# Patient Record
Sex: Female | Born: 1990 | Race: Black or African American | Hispanic: No | Marital: Single | Smoking: Never smoker
Health system: Southern US, Community
[De-identification: ages and names within clinical notes are randomized; demographics above are authoritative.]

## PROBLEM LIST (undated history)

## (undated) DIAGNOSIS — R300 Dysuria: Secondary | ICD-10-CM

## (undated) DIAGNOSIS — D573 Sickle-cell trait: Secondary | ICD-10-CM

## (undated) DIAGNOSIS — R350 Frequency of micturition: Secondary | ICD-10-CM

## (undated) DIAGNOSIS — Z973 Presence of spectacles and contact lenses: Secondary | ICD-10-CM

## (undated) DIAGNOSIS — R310 Gross hematuria: Secondary | ICD-10-CM

## (undated) DIAGNOSIS — K589 Irritable bowel syndrome without diarrhea: Secondary | ICD-10-CM

## (undated) DIAGNOSIS — K219 Gastro-esophageal reflux disease without esophagitis: Secondary | ICD-10-CM

---

## 2011-09-10 HISTORY — PX: LAPAROSCOPIC CHOLECYSTECTOMY: SUR755

## 2016-04-27 ENCOUNTER — Emergency Department (HOSPITAL_COMMUNITY)
Admission: EM | Admit: 2016-04-27 | Discharge: 2016-04-28 | Disposition: A | Discharge status: Home / Self Care | Payer: 59 | Attending: Emergency Medicine | Admitting: Emergency Medicine

## 2016-04-27 ENCOUNTER — Encounter (HOSPITAL_COMMUNITY): Payer: Self-pay

## 2016-04-27 DIAGNOSIS — R319 Hematuria, unspecified: Secondary | ICD-10-CM

## 2016-04-27 DIAGNOSIS — R1010 Upper abdominal pain, unspecified: Secondary | ICD-10-CM

## 2016-04-27 DIAGNOSIS — R1031 Right lower quadrant pain: Secondary | ICD-10-CM | POA: Diagnosis present

## 2016-04-27 DIAGNOSIS — R1030 Lower abdominal pain, unspecified: Secondary | ICD-10-CM

## 2016-04-27 HISTORY — DX: Irritable bowel syndrome without diarrhea: K58.9

## 2016-04-27 LAB — CBC
HCT: 35.5 % — ABNORMAL LOW (ref 36.0–46.0)
Hemoglobin: 12.2 g/dL (ref 12.0–15.0)
MCH: 29.8 pg (ref 26.0–34.0)
MCHC: 34.4 g/dL (ref 30.0–36.0)
MCV: 86.8 fL (ref 78.0–100.0)
PLATELETS: 209 10*3/uL (ref 150–400)
RBC: 4.09 MIL/uL (ref 3.87–5.11)
RDW: 13.1 % (ref 11.5–15.5)
WBC: 5.4 10*3/uL (ref 4.0–10.5)

## 2016-04-27 LAB — COMPREHENSIVE METABOLIC PANEL
ALT: 16 U/L (ref 14–54)
AST: 20 U/L (ref 15–41)
Albumin: 4.4 g/dL (ref 3.5–5.0)
Alkaline Phosphatase: 61 U/L (ref 38–126)
Anion gap: 10 (ref 5–15)
BILIRUBIN TOTAL: 0.8 mg/dL (ref 0.3–1.2)
BUN: 8 mg/dL (ref 6–20)
CALCIUM: 9.4 mg/dL (ref 8.9–10.3)
CHLORIDE: 103 mmol/L (ref 101–111)
CO2: 26 mmol/L (ref 22–32)
Creatinine, Ser: 0.82 mg/dL (ref 0.44–1.00)
Glucose, Bld: 103 mg/dL — ABNORMAL HIGH (ref 65–99)
Potassium: 3.4 mmol/L — ABNORMAL LOW (ref 3.5–5.1)
Sodium: 139 mmol/L (ref 135–145)
TOTAL PROTEIN: 6.9 g/dL (ref 6.5–8.1)

## 2016-04-27 LAB — URINALYSIS, ROUTINE W REFLEX MICROSCOPIC

## 2016-04-27 LAB — URINALYSIS, MICROSCOPIC (REFLEX): WBC UA: NONE SEEN WBC/hpf (ref 0–5)

## 2016-04-27 LAB — CK: Total CK: 81 U/L (ref 38–234)

## 2016-04-27 LAB — LIPASE, BLOOD: LIPASE: 25 U/L (ref 11–51)

## 2016-04-27 LAB — I-STAT BETA HCG BLOOD, ED (MC, WL, AP ONLY)

## 2016-04-27 MED ORDER — SODIUM CHLORIDE 0.9 % IV BOLUS (SEPSIS)
1000.0000 mL | Freq: Once | INTRAVENOUS | Status: AC
  Administered 2016-04-27: 1000 mL via INTRAVENOUS

## 2016-04-27 NOTE — ED Triage Notes (Signed)
Pt here with complaint of chronic abd pain in the RUQ/RLQ. Pt endorses n/v and diarrhea that has been intermittent x 1 week and blood in her urine. Pt has hx of IBS and states that this lower right quadrant pain feels different. Pt ambulatory, VSS.

## 2016-04-27 NOTE — ED Provider Notes (Signed)
[facility]-EMERGENCY DEPT Provider Note   CSN: [id number] Arrival date & time: 04/27/16  1923     History   Chief Complaint Chief Complaint  Patient presents with  . Abdominal Pain    HPI Gwendolyn FinlayLauren Salazar is a 25 y.o. female with a hx of Irritable bowel syndrome and chronic abdominal pain in the right upper and right lower quadrant presents to the Emergency Department complaining of gradual, persistent, progressively worsening right lower quadrant abdominal pain onset personally 1 week ago that feels very different from her chronic pain. She states she's had persistently bloody urine. She reports associated intermittent nausea, vomiting and diarrhea. No hematemesis. No melena or hematochezia. She denies aggravating or alleviating factors. Patient denies fever, chills, nausea, vomiting, shortness of breath, abdominal pain.    The history is provided by the patient and medical records. No language interpreter was used.    Past Medical History:  Diagnosis Date  . IBS (irritable bowel syndrome)     There are no active problems to display for this patient.   Past Surgical History:  Procedure Laterality Date  . CHOLECYSTECTOMY      OB History    No data available       Home Medications    Prior to Admission medications   Medication Sig Start Date End Date Taking? Authorizing Provider  cephALEXin (KEFLEX) 500 MG capsule Take 1 capsule (500 mg total) by mouth 4 (four) times daily. 04/28/16   Dahlia ClientHannah Marti Acebo, PA-C    Family History History reviewed. No pertinent family history.  Social History Social History  Substance Use Topics  . Smoking status: Never Smoker  . Smokeless tobacco: Never Used  . Alcohol use No     Allergies   Patient has no known allergies.   Review of Systems Review of Systems  Gastrointestinal: Positive for nausea and vomiting.  Genitourinary: Positive for dysuria, hematuria and urgency.  All other systems reviewed and are  negative.    Physical Exam Updated Vital Signs BP 106/69   Pulse (!) 56   Temp 98.7 F (37.1 C) (Oral)   Resp 20   Ht 5\' 4"  (1.626 m)   Wt 45.4 kg   LMP 04/20/2016 (Exact Date)   SpO2 99%   BMI 17.16 kg/m   Physical Exam  Constitutional: She appears well-developed and well-nourished. No distress.  Awake, alert, nontoxic appearance  HENT:  Head: Normocephalic and atraumatic.  Mouth/Throat: Oropharynx is clear and moist. No oropharyngeal exudate.  Eyes: Conjunctivae are normal. No scleral icterus.  Neck: Normal range of motion. Neck supple.  Cardiovascular: Normal rate, regular rhythm and intact distal pulses.   Pulmonary/Chest: Effort normal and breath sounds normal. No respiratory distress. She has no wheezes.  Equal chest expansion  Abdominal: Soft. Bowel sounds are normal. She exhibits no mass. There is tenderness in the suprapubic area. There is CVA tenderness ( Left). There is no rebound and no guarding.  Musculoskeletal: Normal range of motion. She exhibits no edema.  Neurological: She is alert.  Speech is clear and goal oriented Moves extremities without ataxia  Skin: Skin is warm and dry. She is not diaphoretic.  Psychiatric: She has a normal mood and affect.  Nursing note and vitals reviewed.    ED Treatments / Results  Labs (all labs ordered are listed, but only abnormal results are displayed) Labs Reviewed  COMPREHENSIVE METABOLIC PANEL - Abnormal; Notable for the following:       Result Value   Potassium 3.4 (*)  Glucose, Bld 103 (*)    All other components within normal limits  CBC - Abnormal; Notable for the following:    HCT 35.5 (*)    All other components within normal limits  URINALYSIS, ROUTINE W REFLEX MICROSCOPIC - Abnormal; Notable for the following:    Color, Urine RED (*)    APPearance TURBID (*)    Glucose, UA   (*)    Value: TEST NOT REPORTED DUE TO COLOR INTERFERENCE OF URINE PIGMENT   Hgb urine dipstick   (*)    Value: TEST NOT  REPORTED DUE TO COLOR INTERFERENCE OF URINE PIGMENT   Bilirubin Urine   (*)    Value: TEST NOT REPORTED DUE TO COLOR INTERFERENCE OF URINE PIGMENT   Ketones, ur   (*)    Value: TEST NOT REPORTED DUE TO COLOR INTERFERENCE OF URINE PIGMENT   Protein, ur   (*)    Value: TEST NOT REPORTED DUE TO COLOR INTERFERENCE OF URINE PIGMENT   Nitrite   (*)    Value: TEST NOT REPORTED DUE TO COLOR INTERFERENCE OF URINE PIGMENT   Leukocytes, UA   (*)    Value: TEST NOT REPORTED DUE TO COLOR INTERFERENCE OF URINE PIGMENT   All other components within normal limits  URINALYSIS, MICROSCOPIC (REFLEX) - Abnormal; Notable for the following:    Bacteria, UA FEW (*)    Squamous Epithelial / LPF 6-30 (*)    All other components within normal limits  URINE CULTURE  LIPASE, BLOOD  CK  I-STAT BETA HCG BLOOD, ED ([facility], [facility], [facility] ONLY)    Radiology Ct Abdomen Pelvis W Contrast  Result Date: 04/28/2016 CLINICAL DATA:  Right lower quadrant abdominal pain. Hematuria. Nausea and vomiting. EXAM: CT ABDOMEN AND PELVIS WITH CONTRAST TECHNIQUE: Multidetector CT imaging of the abdomen and pelvis was performed using the standard protocol following bolus administration of intravenous contrast. CONTRAST:  ISOVUE-300 IOPAMIDOL (ISOVUE-300) INJECTION 61% COMPARISON:  None. FINDINGS: Lower chest: 4 mm subpleural nodule in the left lower lobe is post infectious or inflammatory in a patient of this age. The lung bases are otherwise clear. Hepatobiliary: Probable focal fatty infiltration adjacent to the falciform ligament. No suspicious hepatic lesion. Clips in the gallbladder fossa postcholecystectomy. No biliary dilatation. Pancreas: No ductal dilatation or inflammation. Spleen: Normal in size without focal abnormality. Adrenals/Urinary Tract: Adrenal glands are unremarkable. Kidneys are normal, without renal calculi, focal lesion, or hydronephrosis. Bladder is unremarkable. Stomach/Bowel: Limited evaluation given lack of enteric  contrast and paucity of intra- abdominal fat. Stomach is physiologically distended. Portions of the appendix are tentatively identified in the right lower quadrant and normal. No evidence of pericecal or right lower quadrant inflammation. No evidence of bowel inflammation or distention given limitations. Vascular/Lymphatic: No significant vascular findings are present. No enlarged abdominal or pelvic lymph nodes. Reproductive: A 12 mm peripherally enhancing cyst in the right ovary is likely a corpus luteum. Left ovary grossly normal in size. The uterus is unremarkable. Small volume free fluid in the pelvis is physiologic. Other: No evidence of free air Musculoskeletal: There are no acute or suspicious osseous abnormalities. IMPRESSION: 1. Probable corpus luteal cyst in the right ovary, may be the cause of right lower quadrant pain. 2. Appendix tentatively identified, no evidence of appendicitis. Electronically Signed   By: Rubye Oaks M.D.   On: 04/28/2016 00:49    Procedures Procedures (including critical care time)  Medications Ordered in ED Medications  sodium chloride 0.9 % bolus 1,000 mL (0 mLs  Intravenous Stopped 04/28/16 0040)  iopamidol (ISOVUE-300) 61 % injection (100 mLs  Contrast Given 04/28/16 0026)  cefTRIAXone (ROCEPHIN) 1 g in dextrose 5 % 50 mL IVPB (0 g Intravenous Stopped 04/28/16 0230)  oxyCODONE (Oxy IR/ROXICODONE) immediate release tablet 10 mg (10 mg Oral Given 04/28/16 0146)  ondansetron (ZOFRAN) injection 4 mg (4 mg Intravenous Given 04/28/16 0147)     Initial Impression / Assessment and Plan / ED Course  I have reviewed the triage vital signs and the nursing notes.  Pertinent labs & imaging results that were available during my care of the patient were reviewed by me and considered in my medical decision making (see chart for details).  Clinical Course     Patient presents with acute on chronic abdominal pain.  Pt reports chronic abdominal pain is in the epigastrium.   New abdominal pain is suprapubic in nature. Chest reports gross hematuria. She had 24 hours of dysuria prior to the hematuria. UA is nondiagnostic because of significant blood. CT scan shows no appendicitis. No large masses in the bladder.  Cystitis. Patient is not anemic. No tachycardia or fever. Patient will follow-up with primary care within several days for repeat UA. If hematuria persists patient will need urology follow-up or cystoscopy. We discussed this at length and she agrees to this plan. Also discussed reasons to return including worsening bleeding, fevers, persistent vomiting or other concerns.  Final Clinical Impressions(s) / ED Diagnoses   Final diagnoses:  Hematuria, unspecified type  Lower abdominal pain  Pain of upper abdomen    New Prescriptions Discharge Medication List as of 04/28/2016  2:05 AM    START taking these medications   Details  cephALEXin (KEFLEX) 500 MG capsule Take 1 capsule (500 mg total) by mouth 4 (four) times daily., Starting Sat 04/28/2016, [employer]         Maaz Spiering, PA-C 04/28/16 [id number]    Zadie Rhineonald Wickline, MD 04/28/16 [phone]

## 2016-04-28 ENCOUNTER — Emergency Department (HOSPITAL_COMMUNITY): Payer: 59

## 2016-04-28 MED ORDER — CEPHALEXIN 500 MG PO CAPS: 500.0000 mg | ORAL_CAPSULE | Freq: Four times a day (QID) | ORAL | 0 refills | Status: DC

## 2016-04-28 MED ORDER — ONDANSETRON HCL 4 MG/2ML IJ SOLN
4.0000 mg | Freq: Once | INTRAMUSCULAR | Status: AC
Start: 2016-04-28 — End: 2016-04-28
  Administered 2016-04-28: 4 mg via INTRAVENOUS
  Filled 2016-04-28: qty 2

## 2016-04-28 MED ORDER — IOPAMIDOL (ISOVUE-300) INJECTION 61%
INTRAVENOUS | Status: AC
  Administered 2016-04-28: 100 mL
  Filled 2016-04-28: qty 100

## 2016-04-28 MED ORDER — OXYCODONE HCL 5 MG PO TABS
10.0000 mg | ORAL_TABLET | Freq: Once | ORAL | Status: AC
  Administered 2016-04-28: 10 mg via ORAL
  Filled 2016-04-28: qty 2

## 2016-04-28 MED ORDER — CEFTRIAXONE SODIUM 1 G IJ SOLR
1.0000 g | Freq: Once | INTRAMUSCULAR | Status: AC
  Administered 2016-04-28: 1 g via INTRAVENOUS
  Filled 2016-04-28: qty 10

## 2016-04-28 NOTE — Discharge Instructions (Signed)
1. Medications: keflex, usual home medications °2. Treatment: rest, drink plenty of fluids, take medications as prescribed °3. Follow Up: Please followup with your primary doctor in 3 days for discussion of your diagnoses and further evaluation after today's visit; if you do not have a primary care doctor use the resource guide provided to find one; return to the ER for fevers, persistent vomiting, worsening abdominal pain or other concerning symptoms. ° °

## 2016-04-29 LAB — URINE CULTURE: Culture: <10000 — AB

## 2016-07-11 ENCOUNTER — Emergency Department
Admission: EM | Admit: 2016-07-11 | Discharge: 2016-07-11 | Disposition: A | Discharge status: Home / Self Care | Payer: 59 | Attending: Emergency Medicine | Admitting: Emergency Medicine

## 2016-07-11 ENCOUNTER — Emergency Department: Payer: 59

## 2016-07-11 DIAGNOSIS — E876 Hypokalemia: Secondary | ICD-10-CM | POA: Insufficient documentation

## 2016-07-11 DIAGNOSIS — N390 Urinary tract infection, site not specified: Secondary | ICD-10-CM | POA: Diagnosis not present

## 2016-07-11 DIAGNOSIS — R1031 Right lower quadrant pain: Secondary | ICD-10-CM

## 2016-07-11 DIAGNOSIS — R319 Hematuria, unspecified: Secondary | ICD-10-CM

## 2016-07-11 LAB — COMPREHENSIVE METABOLIC PANEL
ALT: 33 U/L (ref 14–54)
ANION GAP: 11 (ref 5–15)
AST: 43 U/L — ABNORMAL HIGH (ref 15–41)
Albumin: 5 g/dL (ref 3.5–5.0)
Alkaline Phosphatase: 69 U/L (ref 38–126)
BUN: 11 mg/dL (ref 6–20)
CALCIUM: 9.7 mg/dL (ref 8.9–10.3)
CHLORIDE: 105 mmol/L (ref 101–111)
CO2: 23 mmol/L (ref 22–32)
Creatinine, Ser: 0.88 mg/dL (ref 0.44–1.00)
GFR calc non Af Amer: >60 mL/min (ref >60)
Glucose, Bld: 132 mg/dL — ABNORMAL HIGH (ref 65–99)
Potassium: 2.9 mmol/L — ABNORMAL LOW (ref 3.5–5.1)
SODIUM: 139 mmol/L (ref 135–145)
Total Bilirubin: 1 mg/dL (ref 0.3–1.2)
Total Protein: 8.2 g/dL — ABNORMAL HIGH (ref 6.5–8.1)

## 2016-07-11 LAB — LIPASE, BLOOD: LIPASE: 22 U/L (ref 11–51)

## 2016-07-11 LAB — URINALYSIS, COMPLETE (UACMP) WITH MICROSCOPIC
Bacteria, UA: NONE SEEN
Bilirubin Urine: NEGATIVE
GLUCOSE, UA: 50 mg/dL — AB
Ketones, ur: 80 mg/dL — AB
Leukocytes, UA: NEGATIVE
Nitrite: NEGATIVE
PROTEIN: 100 mg/dL — AB
Specific Gravity, Urine: 1.018 (ref 1.005–1.030)
pH: 6 (ref 5.0–8.0)

## 2016-07-11 LAB — CBC
HCT: 37.9 % (ref 35.0–47.0)
HEMOGLOBIN: 13 g/dL (ref 12.0–16.0)
MCH: 30.9 pg (ref 26.0–34.0)
MCHC: 34.2 g/dL (ref 32.0–36.0)
MCV: 90.4 fL (ref 80.0–100.0)
Platelets: 209 10*3/uL (ref 150–440)
RBC: 4.19 MIL/uL (ref 3.80–5.20)
RDW: 13.7 % (ref 11.5–14.5)
WBC: 10.3 10*3/uL (ref 3.6–11.0)

## 2016-07-11 LAB — POCT PREGNANCY, URINE: Preg Test, Ur: NEGATIVE

## 2016-07-11 MED ORDER — ONDANSETRON HCL 4 MG/2ML IJ SOLN
INTRAMUSCULAR | Status: AC
  Administered 2016-07-11: 4 mg via INTRAVENOUS
  Filled 2016-07-11: qty 2

## 2016-07-11 MED ORDER — ONDANSETRON HCL 4 MG/2ML IJ SOLN
4.0000 mg | Freq: Once | INTRAMUSCULAR | Status: AC
  Administered 2016-07-11: 4 mg via INTRAVENOUS

## 2016-07-11 MED ORDER — POTASSIUM CHLORIDE CRYS ER 20 MEQ PO TBCR
40.0000 meq | EXTENDED_RELEASE_TABLET | Freq: Once | ORAL | Status: AC
  Administered 2016-07-11: 40 meq via ORAL

## 2016-07-11 MED ORDER — POTASSIUM CHLORIDE CRYS ER 20 MEQ PO TBCR
EXTENDED_RELEASE_TABLET | ORAL | Status: AC
  Administered 2016-07-11: 40 meq via ORAL
  Filled 2016-07-11: qty 2

## 2016-07-11 MED ORDER — MORPHINE SULFATE (PF) 4 MG/ML IV SOLN
INTRAVENOUS | Status: AC
  Administered 2016-07-11: 4 mg via INTRAVENOUS
  Filled 2016-07-11: qty 1

## 2016-07-11 MED ORDER — POTASSIUM CHLORIDE ER 10 MEQ PO TBCR: 10.0000 meq | EXTENDED_RELEASE_TABLET | Freq: Once | ORAL | 0 refills | Status: DC

## 2016-07-11 MED ORDER — IOPAMIDOL (ISOVUE-300) INJECTION 61%
30.0000 mL | Freq: Once | INTRAVENOUS | Status: AC
  Administered 2016-07-11: 30 mL via ORAL
  Filled 2016-07-11: qty 30

## 2016-07-11 MED ORDER — MORPHINE SULFATE (PF) 4 MG/ML IV SOLN
4.0000 mg | Freq: Once | INTRAVENOUS | Status: AC
  Administered 2016-07-11: 4 mg via INTRAVENOUS

## 2016-07-11 MED ORDER — CEPHALEXIN 500 MG PO CAPS
500.0000 mg | ORAL_CAPSULE | Freq: Once | ORAL | Status: AC
  Administered 2016-07-11: 500 mg via ORAL
  Filled 2016-07-11: qty 1

## 2016-07-11 MED ORDER — IOPAMIDOL (ISOVUE-300) INJECTION 61%
75.0000 mL | Freq: Once | INTRAVENOUS | Status: AC | PRN
  Administered 2016-07-11: 75 mL via INTRAVENOUS
  Filled 2016-07-11: qty 75

## 2016-07-11 MED ORDER — CEPHALEXIN 500 MG PO CAPS: 500.0000 mg | ORAL_CAPSULE | Freq: Three times a day (TID) | ORAL | 0 refills | Status: AC

## 2016-07-11 MED ORDER — SODIUM CHLORIDE 0.9 % IV BOLUS (SEPSIS)
1000.0000 mL | Freq: Once | INTRAVENOUS | Status: AC
  Administered 2016-07-11: 1000 mL via INTRAVENOUS

## 2016-07-11 NOTE — ED Notes (Signed)
Pt sleeping in lobby, when awoke pt states "my stomach is killing me". States RLQ abd pain that began this AM around 1100, pt awake and alert in no acute distress

## 2016-07-11 NOTE — ED Provider Notes (Signed)
[facility] Emergency Department Provider Note  ____________________________________________   First MD Initiated Contact with Patient 07/11/16 1458     (approximate)  I have reviewed the triage vital signs and the nursing notes.   HISTORY  Chief Complaint Abdominal Pain   HPI Gwendolyn FinlayLauren Wlodarczyk is a 26 y.o. female with a history of Crohn's disease as well as IBS was presenting with right lower quadrant pain that started this morning. She says that she is also had burning with urination and blood in her urine over the past several months. She is currently undergoing a workup with urology and has an appointment coming this Monday. She says in December she was having similar issues and was given antibiotics which resolved the issue is temporarily. She says that her urine appears to be dark in color which is also how she percent previously with a similar issue. She says the pain is 9 out of 10 and cramping. Radiates to her right lower back as well. Also with diarrhea with a small amount of blood in it this morning. Denies any nausea or vomiting. Denies any vaginal bleeding or discharge.   Past Medical History:  Diagnosis Date  . IBS (irritable bowel syndrome)     There are no active problems to display for this patient.   Past Surgical History:  Procedure Laterality Date  . CHOLECYSTECTOMY      Prior to Admission medications   Medication Sig Start Date End Date Taking? Authorizing Provider  cephALEXin (KEFLEX) 500 MG capsule Take 1 capsule (500 mg total) by mouth 4 (four) times daily. 04/28/16   Hannah Muthersbaugh, PA-C    Allergies Patient has no known allergies.  No family history on file.  Social History Social History  Substance Use Topics  . Smoking status: Never Smoker  . Smokeless tobacco: Never Used  . Alcohol use No    Review of Systems Constitutional: No fever/chills Eyes: No visual changes. ENT: No sore throat. Cardiovascular: Denies  chest pain. Respiratory: Denies shortness of breath. Gastrointestinal:  No constipation. Genitourinary: Negative for dysuria. Musculoskeletal: as above Skin: Negative for rash. Neurological: Negative for headaches, focal weakness or numbness.  10-point ROS otherwise negative.  ____________________________________________   PHYSICAL EXAM:  VITAL SIGNS: ED Triage Vitals  Enc Vitals Group     BP 07/11/16 1326 137/81     Pulse Rate 07/11/16 1326 62     Resp 07/11/16 1326 (!) 22     Temp 07/11/16 1326 97.8 F (36.6 C)     Temp Source 07/11/16 1326 Oral     SpO2 07/11/16 1326 100 %     Weight 07/11/16 1326 100 lb (45.4 kg)     Height 07/11/16 1326 5\' 3"  (1.6 m)     Head Circumference --      Peak Flow --      Pain Score 07/11/16 1330 10     Pain Loc --      Pain Edu? --      Excl. in GC? --     Constitutional: Alert and oriented. Well appearing and in no acute distress. Eyes: Conjunctivae are normal. PERRL. EOMI. Head: Atraumatic. Nose: No congestion/rhinnorhea. Mouth/Throat: Mucous membranes are moist.   Neck: No stridor.   Cardiovascular: Normal rate, regular rhythm. Grossly normal heart sounds.   Respiratory: Normal respiratory effort.  No retractions. Lungs CTAB. Gastrointestinal: Soft With moderate right upper and right lower quadrant tenderness with a mild amount of guarding. No distention.  No CVA tenderness. Musculoskeletal:  No lower extremity tenderness nor edema.  No joint effusions. Neurologic:  Normal speech and language. No gross focal neurologic deficits are appreciated.  Skin:  Skin is warm, dry and intact. No rash noted. Psychiatric: Mood and affect are normal. Speech and behavior are normal.  ____________________________________________   LABS (all labs ordered are listed, but only abnormal results are displayed)  Labs Reviewed  COMPREHENSIVE METABOLIC PANEL - Abnormal; Notable for the following:       Result Value   Potassium 2.9 (*)    Glucose,  Bld 132 (*)    Total Protein 8.2 (*)    AST 43 (*)    All other components within normal limits  URINALYSIS, COMPLETE (UACMP) WITH MICROSCOPIC - Abnormal; Notable for the following:    Color, Urine RED (*)    APPearance CLOUDY (*)    Glucose, UA 50 (*)    Hgb urine dipstick LARGE (*)    Ketones, ur 80 (*)    Protein, ur 100 (*)    Squamous Epithelial / LPF 0-5 (*)    All other components within normal limits  URINE CULTURE  LIPASE, BLOOD  CBC  POC URINE PREG, ED  POCT PREGNANCY, URINE   ____________________________________________  EKG   ____________________________________________  RADIOLOGY    CT Abdomen Pelvis W Contrast (Final result)  Result time 07/11/16 18:49:56  Final result by Signa Kell, MD (07/11/16 18:49:56)           Narrative:   CLINICAL DATA: Right lower quadrant pain  EXAM: CT ABDOMEN AND PELVIS WITH CONTRAST  TECHNIQUE: Multidetector CT imaging of the abdomen and pelvis was performed using the standard protocol following bolus administration of intravenous contrast.  CONTRAST: 75mL ISOVUE-300 IOPAMIDOL (ISOVUE-300) INJECTION 61%  COMPARISON: 04/28/2016  FINDINGS: Lower chest: Nodule within the left lower lobe measures 3 mm and is unchanged from previous exam. No pleural effusion identified.  Hepatobiliary: No focal liver abnormality. Previous cholecystectomy. No biliary dilatation.  Pancreas: Unremarkable. No pancreatic ductal dilatation or surrounding inflammatory changes.  Spleen: Normal in size without focal abnormality.  Adrenals/Urinary Tract: The adrenal glands are normal. No mass or hydronephrosis identified. The urinary bladder appears normal.  Stomach/Bowel: The stomach is unremarkable. The small bowel loops have a normal course and caliber. The appendix is not visualized separate from the right lower quadrant bowel loops. No secondary signs of acute appendicitis. No pathologic dilatation of the  colon.  Vascular/Lymphatic: Normal appearance of the abdominal aorta. No enlarged retroperitoneal or mesenteric adenopathy. No enlarged pelvic or inguinal lymph nodes.  Reproductive: Uterus and bilateral adnexa are unremarkable.  Other: No abdominal wall hernia or abnormality. No abdominopelvic ascites.  Musculoskeletal: No acute or significant osseous findings.  IMPRESSION: 1. No acute findings within the abdomen or pelvis. 2. Nonvisualization of the appendix. No secondary signs of acute appendicitis.   Electronically Signed By: Signa Kell M.D. On: 07/11/2016 18:49          ____________________________________________   PROCEDURES  Procedure(s) performed:   Procedures  Critical Care performed:   ____________________________________________   INITIAL IMPRESSION / ASSESSMENT AND PLAN / ED COURSE  Pertinent labs & imaging results that were available during my care of the patient were reviewed by me and considered in my medical decision making (see chart for details).  ----------------------------------------- 7:06 PM on 07/11/2016 -----------------------------------------  Patient resting over this time. Reassuring CAT scan. She'll be treated again with Keflex as she has been treated in the past for similar issues. She'll be following up with  urology this coming Monday at her scheduled appointment. Unlikely to be appendicitis as there are no secondary signs and the patient has more likely causes for her symptoms. I asked when the radiology or reports as well as the lab results the patient as well as the plan and she is understanding and willing to comply.      ____________________________________________   FINAL CLINICAL IMPRESSION(S) / ED DIAGNOSES  Abdominal pain. UTI.    NEW MEDICATIONS STARTED DURING THIS VISIT:  New Prescriptions   No medications on file     Note:  This document was prepared using Dragon voice recognition software and  may include unintentional dictation errors.    Myrna Blazer, MD 07/11/16 [phone]

## 2016-07-11 NOTE — ED Triage Notes (Signed)
RLQ pain that started at 11AM today. Pt very anxious. Pt alert and oriented X4. .Marland Kitchen

## 2016-07-13 LAB — URINE CULTURE: Culture: 50000 — AB

## 2016-07-16 ENCOUNTER — Other Ambulatory Visit: Payer: Self-pay | Admitting: Urology

## 2016-07-19 ENCOUNTER — Encounter (HOSPITAL_BASED_OUTPATIENT_CLINIC_OR_DEPARTMENT_OTHER): Payer: Self-pay | Admitting: *Deleted

## 2016-07-23 ENCOUNTER — Encounter (HOSPITAL_BASED_OUTPATIENT_CLINIC_OR_DEPARTMENT_OTHER): Payer: Self-pay | Admitting: *Deleted

## 2016-07-23 NOTE — Progress Notes (Signed)
NPO AFTER MN.  ARRIVE AT 1000.  NEEDS ISTAT (TO RECHECK K+, WAS 2.9 on 02.21.2018, WAS PUT k+ supplement).  OTHER CURRENT LAB RESULTS IN CHART AND EPIC.

## 2016-07-26 ENCOUNTER — Ambulatory Visit (HOSPITAL_BASED_OUTPATIENT_CLINIC_OR_DEPARTMENT_OTHER): Payer: 59 | Admitting: Anesthesiology

## 2016-07-26 ENCOUNTER — Encounter (HOSPITAL_BASED_OUTPATIENT_CLINIC_OR_DEPARTMENT_OTHER): Payer: Self-pay | Admitting: *Deleted

## 2016-07-26 ENCOUNTER — Ambulatory Visit (HOSPITAL_BASED_OUTPATIENT_CLINIC_OR_DEPARTMENT_OTHER)
Admission: RE | Admit: 2016-07-26 | Discharge: 2016-07-26 | Disposition: A | Discharge status: Home / Self Care | Payer: 59 | Source: Ambulatory Visit | Attending: Urology | Admitting: Urology

## 2016-07-26 ENCOUNTER — Encounter (HOSPITAL_BASED_OUTPATIENT_CLINIC_OR_DEPARTMENT_OTHER)
Admission: RE | Disposition: A | Discharge status: Home / Self Care | Payer: Self-pay | Source: Ambulatory Visit | Attending: Urology

## 2016-07-26 DIAGNOSIS — K219 Gastro-esophageal reflux disease without esophagitis: Secondary | ICD-10-CM | POA: Diagnosis not present

## 2016-07-26 DIAGNOSIS — R350 Frequency of micturition: Secondary | ICD-10-CM | POA: Diagnosis not present

## 2016-07-26 DIAGNOSIS — R310 Gross hematuria: Secondary | ICD-10-CM | POA: Diagnosis not present

## 2016-07-26 DIAGNOSIS — K589 Irritable bowel syndrome without diarrhea: Secondary | ICD-10-CM | POA: Diagnosis not present

## 2016-07-26 DIAGNOSIS — Z9049 Acquired absence of other specified parts of digestive tract: Secondary | ICD-10-CM | POA: Insufficient documentation

## 2016-07-26 DIAGNOSIS — D573 Sickle-cell trait: Secondary | ICD-10-CM | POA: Insufficient documentation

## 2016-07-26 DIAGNOSIS — Z79899 Other long term (current) drug therapy: Secondary | ICD-10-CM | POA: Insufficient documentation

## 2016-07-26 DIAGNOSIS — E876 Hypokalemia: Secondary | ICD-10-CM | POA: Insufficient documentation

## 2016-07-26 DIAGNOSIS — R300 Dysuria: Secondary | ICD-10-CM | POA: Insufficient documentation

## 2016-07-26 HISTORY — DX: Dysuria: R30.0

## 2016-07-26 HISTORY — DX: Sickle-cell trait: D57.3

## 2016-07-26 HISTORY — PX: CYSTOSCOPY W/ RETROGRADES: SHX1426

## 2016-07-26 HISTORY — PX: CYSTOSCOPY/URETEROSCOPY/HOLMIUM LASER/STENT PLACEMENT: SHX6546

## 2016-07-26 HISTORY — DX: Presence of spectacles and contact lenses: Z97.3

## 2016-07-26 HISTORY — DX: Gastro-esophageal reflux disease without esophagitis: K21.9

## 2016-07-26 HISTORY — DX: Frequency of micturition: R35.0

## 2016-07-26 HISTORY — DX: Gross hematuria: R31.0

## 2016-07-26 LAB — POCT PREGNANCY, URINE: Preg Test, Ur: NEGATIVE

## 2016-07-26 LAB — POCT I-STAT 4, (NA,K, GLUC, HGB,HCT)
GLUCOSE: 80 mg/dL (ref 65–99)
HCT: 41 % (ref 36.0–46.0)
Hemoglobin: 13.9 g/dL (ref 12.0–15.0)
POTASSIUM: 3.8 mmol/L (ref 3.5–5.1)
SODIUM: 142 mmol/L (ref 135–145)

## 2016-07-26 SURGERY — CYSTOSCOPY/URETEROSCOPY/HOLMIUM LASER/STENT PLACEMENT
Anesthesia: General | Site: Renal | Laterality: Bilateral

## 2016-07-26 MED ORDER — MIDAZOLAM HCL 5 MG/5ML IJ SOLN
INTRAMUSCULAR | Status: DC | PRN
  Administered 2016-07-26: 2 mg via INTRAVENOUS

## 2016-07-26 MED ORDER — TAMSULOSIN HCL 0.4 MG PO CAPS: 0.4000 mg | ORAL_CAPSULE | Freq: Every day | ORAL | 0 refills | Status: AC

## 2016-07-26 MED ORDER — CEFAZOLIN IN D5W 1 GM/50ML IV SOLN
1.0000 g | INTRAVENOUS | Status: DC
  Filled 2016-07-26: qty 50

## 2016-07-26 MED ORDER — CEFAZOLIN SODIUM-DEXTROSE 2-4 GM/100ML-% IV SOLN
INTRAVENOUS | Status: AC
  Filled 2016-07-26: qty 100

## 2016-07-26 MED ORDER — DEXAMETHASONE SODIUM PHOSPHATE 10 MG/ML IJ SOLN
INTRAMUSCULAR | Status: AC
  Filled 2016-07-26: qty 1

## 2016-07-26 MED ORDER — LIDOCAINE 2% (20 MG/ML) 5 ML SYRINGE
INTRAMUSCULAR | Status: DC | PRN
  Administered 2016-07-26: 80 mg via INTRAVENOUS

## 2016-07-26 MED ORDER — DEXAMETHASONE SODIUM PHOSPHATE 10 MG/ML IJ SOLN
INTRAMUSCULAR | Status: DC | PRN
  Administered 2016-07-26: 10 mg via INTRAVENOUS

## 2016-07-26 MED ORDER — KETOROLAC TROMETHAMINE 30 MG/ML IJ SOLN
INTRAMUSCULAR | Status: AC
  Filled 2016-07-26: qty 1

## 2016-07-26 MED ORDER — PROPOFOL 500 MG/50ML IV EMUL
INTRAVENOUS | Status: DC | PRN
  Administered 2016-07-26: 40 mL via INTRAVENOUS
  Administered 2016-07-26: 160 mL via INTRAVENOUS

## 2016-07-26 MED ORDER — OXYBUTYNIN CHLORIDE ER 10 MG PO TB24: 10.0000 mg | ORAL_TABLET | Freq: Every day | ORAL | 0 refills | Status: DC

## 2016-07-26 MED ORDER — MIDAZOLAM HCL 2 MG/2ML IJ SOLN
INTRAMUSCULAR | Status: AC
  Filled 2016-07-26: qty 2

## 2016-07-26 MED ORDER — ONDANSETRON HCL 4 MG/2ML IJ SOLN
INTRAMUSCULAR | Status: AC
  Filled 2016-07-26: qty 2

## 2016-07-26 MED ORDER — SODIUM CHLORIDE 0.9 % IR SOLN
Status: DC | PRN
  Administered 2016-07-26: 4000 mL

## 2016-07-26 MED ORDER — OXYCODONE-ACETAMINOPHEN 5-325 MG PO TABS: 1.0000 | ORAL_TABLET | ORAL | 0 refills | Status: DC | PRN

## 2016-07-26 MED ORDER — HYDROMORPHONE HCL 1 MG/ML IJ SOLN
0.2500 mg | INTRAMUSCULAR | Status: DC | PRN
  Administered 2016-07-26 (×2): 0.5 mg via INTRAVENOUS
  Filled 2016-07-26: qty 0.5

## 2016-07-26 MED ORDER — LACTATED RINGERS IV SOLN
INTRAVENOUS | Status: DC
  Administered 2016-07-26 (×2): via INTRAVENOUS
  Filled 2016-07-26: qty 1000

## 2016-07-26 MED ORDER — HYDROMORPHONE HCL 2 MG/ML IJ SOLN
INTRAMUSCULAR | Status: AC
  Filled 2016-07-26: qty 1

## 2016-07-26 MED ORDER — FENTANYL CITRATE (PF) 100 MCG/2ML IJ SOLN
INTRAMUSCULAR | Status: DC | PRN
  Administered 2016-07-26 (×4): 25 ug via INTRAVENOUS

## 2016-07-26 MED ORDER — ONDANSETRON HCL 4 MG/2ML IJ SOLN
INTRAMUSCULAR | Status: DC | PRN
  Administered 2016-07-26: 4 mg via INTRAVENOUS

## 2016-07-26 MED ORDER — FENTANYL CITRATE (PF) 100 MCG/2ML IJ SOLN
INTRAMUSCULAR | Status: AC
  Filled 2016-07-26: qty 2

## 2016-07-26 MED ORDER — PHENAZOPYRIDINE HCL 100 MG PO TABS: 100.0000 mg | ORAL_TABLET | Freq: Three times a day (TID) | ORAL | 0 refills | Status: AC | PRN

## 2016-07-26 MED ORDER — IOHEXOL 300 MG/ML  SOLN
INTRAMUSCULAR | Status: DC | PRN
  Administered 2016-07-26: 10 mL

## 2016-07-26 MED ORDER — CEFAZOLIN SODIUM-DEXTROSE 2-4 GM/100ML-% IV SOLN
2.0000 g | INTRAVENOUS | Status: AC
  Administered 2016-07-26 (×2): 2 g via INTRAVENOUS
  Filled 2016-07-26: qty 100

## 2016-07-26 MED ORDER — KETOROLAC TROMETHAMINE 30 MG/ML IJ SOLN
INTRAMUSCULAR | Status: DC | PRN
  Administered 2016-07-26: 30 mg via INTRAVENOUS

## 2016-07-26 MED ORDER — PROPOFOL 10 MG/ML IV BOLUS
INTRAVENOUS | Status: AC
Start: 2016-07-26 — End: 2016-07-26
  Filled 2016-07-26: qty 20

## 2016-07-26 SURGICAL SUPPLY — 24 items
BAG DRAIN URO-CYSTO SKYTR STRL (DRAIN) ×6 IMPLANT
BASKET ZERO TIP NITINOL 2.4FR (BASKET) IMPLANT
CATH INTERMIT  6FR 70CM (CATHETERS) ×3 IMPLANT
CLOTH BEACON ORANGE TIMEOUT ST (SAFETY) ×3 IMPLANT
GLOVE BIO SURGEON STRL SZ8 (GLOVE) ×3 IMPLANT
GLOVE BIOGEL PI IND STRL 7.0 (GLOVE) ×2 IMPLANT
GLOVE BIOGEL PI INDICATOR 7.0 (GLOVE) ×4
GLOVE ECLIPSE 7.0 STRL STRAW (GLOVE) ×3 IMPLANT
GOWN STRL REUS W/ TWL LRG LVL3 (GOWN DISPOSABLE) IMPLANT
GOWN STRL REUS W/ TWL XL LVL3 (GOWN DISPOSABLE) ×2 IMPLANT
GOWN STRL REUS W/TWL LRG LVL3 (GOWN DISPOSABLE)
GOWN STRL REUS W/TWL XL LVL3 (GOWN DISPOSABLE) ×4
GUIDEWIRE ANG ZIPWIRE 038X150 (WIRE) ×3 IMPLANT
GUIDEWIRE STR DUAL SENSOR (WIRE) ×3 IMPLANT
IV NS 1000ML (IV SOLUTION) ×2
IV NS 1000ML BAXH (IV SOLUTION) ×1 IMPLANT
IV NS IRRIG 3000ML ARTHROMATIC (IV SOLUTION) ×3 IMPLANT
KIT RM TURNOVER CYSTO AR (KITS) ×3 IMPLANT
MANIFOLD NEPTUNE II (INSTRUMENTS) ×3 IMPLANT
NS IRRIG 500ML POUR BTL (IV SOLUTION) ×3 IMPLANT
PACK CYSTO (CUSTOM PROCEDURE TRAY) ×3 IMPLANT
STENT URET 6FRX24 CONTOUR (STENTS) ×6 IMPLANT
TUBE CONNECTING 12'X1/4 (SUCTIONS)
TUBE CONNECTING 12X1/4 (SUCTIONS) IMPLANT

## 2016-07-26 NOTE — Anesthesia Postprocedure Evaluation (Signed)
Anesthesia Post Note  Patient: Gwendolyn FinlayLauren Routt  Procedure(s) Performed: Procedure(s) (LRB): CYSTOSCOPY/  LEFT DIAGNOSTIC URETEROSCOPY//STENT PLACEMENT (Bilateral) CYSTOSCOPY WITH RETROGRADE PYELOGRAM (Bilateral)  Patient location during evaluation: PACU Anesthesia Type: General Level of consciousness: awake Vital Signs Assessment: post-procedure vital signs reviewed and stable Respiratory status: spontaneous breathing Anesthetic complications: no       Last Vitals:  Vitals:   07/26/16 1230 07/26/16 1245  BP: (!) 124/96 138/89  Pulse: (!) 58 (!) 50  Resp: 15 10  Temp:      Last Pain:  Vitals:   07/26/16 1245  TempSrc:   PainSc: 7                  Davi Kroon

## 2016-07-26 NOTE — Anesthesia Procedure Notes (Signed)
Procedure Name: LMA Insertion Date/Time: 07/26/2016 11:51 AM Performed by: Jessica PriestBEESON, Kaydyn Sayas C Pre-anesthesia Checklist: Patient identified, Emergency Drugs available, Suction available and Patient being monitored Patient Re-evaluated:Patient Re-evaluated prior to inductionOxygen Delivery Method: Circle system utilized Preoxygenation: Pre-oxygenation with 100% oxygen Intubation Type: IV induction Ventilation: Mask ventilation without difficulty LMA: LMA inserted LMA Size: 3.0 Number of attempts: 1 Airway Equipment and Method: Bite block Placement Confirmation: positive ETCO2 and breath sounds checked- equal and bilateral Tube secured with: Tape Dental Injury: Teeth and Oropharynx as per pre-operative assessment

## 2016-07-26 NOTE — Transfer of Care (Signed)
Immediate Anesthesia Transfer of Care Note  Patient: Gwendolyn FinlayLauren Salazar  Procedure(s) Performed: Procedure(s) (LRB): CYSTOSCOPY/  LEFT DIAGNOSTIC URETEROSCOPY//STENT PLACEMENT (Bilateral) CYSTOSCOPY WITH RETROGRADE PYELOGRAM (Bilateral)  Patient Location: PACU  Anesthesia Type: General  Level of Consciousness: awake, sedated, patient cooperative and responds to stimulation  Airway & Oxygen Therapy: Patient Spontanous Breathing and Patient connected to [place] O2  Post-op Assessment: Report given to PACU RN, Post -op Vital signs reviewed and stable and Patient moving all extremities  Post vital signs: Reviewed and stable  Complications: No apparent anesthesia complications

## 2016-07-26 NOTE — Discharge Instructions (Signed)
°Post Anesthesia Home Care Instructions ° °Activity: °Get plenty of rest for the remainder of the day. A responsible adult should stay with you for 24 hours following the procedure.  °For the next 24 hours, DO NOT: °-Drive a car °-Operate machinery °-Drink alcoholic beverages °-Take any medication unless instructed by your physician °-Make any legal decisions or sign important papers. ° °Meals: °Start with liquid foods such as gelatin or soup. Progress to regular foods as tolerated. Avoid greasy, spicy, heavy foods. If nausea and/or vomiting occur, drink only clear liquids until the nausea and/or vomiting subsides. Call your physician if vomiting continues. ° °Special Instructions/Symptoms: °Your throat may feel dry or sore from the anesthesia or the breathing tube placed in your throat during surgery. If this causes discomfort, gargle with warm salt water. The discomfort should disappear within 24 hours. ° °If you had a scopolamine patch placed behind your ear for the management of post- operative nausea and/or vomiting: ° °1. The medication in the patch is effective for 72 hours, after which it should be removed.  Wrap patch in a tissue and discard in the trash. Wash hands thoroughly with soap and water. °2. You may remove the patch earlier than 72 hours if you experience unpleasant side effects which may include dry mouth, dizziness or visual disturbances. °3. Avoid touching the patch. Wash your hands with soap and water after contact with the patch. °  °[facility] °[phone] °Post Ureteroscopy With or Without Stent Instructions ° °Definitions: ° °Ureter: The duct that transports urine from the kidney to the bladder. °Stent:   A plastic hollow tube that is placed into the ureter, from the kidney to the                 bladder to prevent the ureter from swelling shut. ° °GENERAL INSTRUCTIONS: ° °Despite the fact that no skin incisions were used, the area around the ureter and bladder is raw  and irritated. The stent is a foreign body which will further irritate the bladder wall. This irritation is manifested by increased frequency of urination, both day and night, and by an increase in the urge to urinate. In some, the urge to urinate is present almost always. Sometimes the urge is strong enough that you may not be able to stop yourself from urinating. The only real cure is to remove the stent and then give time for the bladder wall to heal which can't be done until the danger of the ureter swelling shut has passed, which varies. ° °You may see some blood in your urine while the stent is in place and a few days afterwards. Do not be alarmed, even if the urine was clear for a while. Get off your feet and drink lots of fluids until clearing occurs. If you start to pass clots or don't improve, call us. ° °DIET: °You may return to your normal diet immediately. Because of the raw surface of your bladder, alcohol, spicy foods, acid type foods and drinks with caffeine may cause irritation or frequency and should be used in moderation. To keep your urine flowing freely and to avoid constipation, drink plenty of fluids during the day ( 8-10 glasses ). °Tip: Avoid cranberry juice because it is very acidic. ° °ACTIVITY: °Your physical activity doesn't need to be restricted. However, if you are very active, you may see some blood in your urine. We suggest that you reduce your activity under these circumstances until the bleeding has stopped. ° °BOWELS: °  It is important to keep your bowels regular during the postoperative period. Straining with bowel movements can cause bleeding. A bowel movement every other day is reasonable. Use a mild laxative if needed, such as Milk of Magnesia 2-3 tablespoons, or 2 Dulcolax tablets. Call if you continue to have problems. If you have been taking narcotics for pain, before, during or after your surgery, you may be constipated. Take a laxative if necessary.   MEDICATION: You  should resume your pre-surgery medications unless told not to. In addition you will often be given an antibiotic to prevent infection. These should be taken as prescribed until the bottles are finished unless you are having an unusual reaction to one of the drugs.  PROBLEMS YOU SHOULD REPORT TO US:  Fevers over 100.5 Fahrenheit.  Heavy bleeding, or clots ( See above notes about blood in urine ).  Inability to urinate.  Drug reactions ( hives, rash, nausea, vomiting, diarrhea ).  Severe burning or pain with urination that is not improving.  FOLLOW-UP: You will need a follow-up appointment to monitor your progress. Call for this appointment at the number listed above. Usually the first appointment will be about three to fourteen days after your surgery.     Ureteroscopy, Care After This sheet gives you information about how to care for yourself after your procedure. Your health care provider may also give you more specific instructions. If you have problems or questions, contact your health care provider. What can I expect after the procedure? After the procedure, it is common to have:  A burning sensation when you urinate.  Blood in your urine.  Mild discomfort in the bladder area or kidney area when urinating.  Needing to urinate more often or urgently. Follow these instructions at home: Medicines   Take over-the-counter and prescription medicines only as told by your health care provider.  If you were prescribed an antibiotic medicine, take it as told by your health care provider. Do not stop taking the antibiotic even if you start to feel better. General instructions    Donot drive for 24 hours if you were given a medicine to help you relax (sedative) during your procedure.  To relieve burning, try taking a warm bath or holding a warm washcloth over your groin.  Drink enough fluid to keep your urine clear or pale yellow.  Drink two 8-ounce glasses of water every hour  for the first 2 hours after you get home.  Continue to drink water often at home.  You can eat what you usually do.  Keep all follow-up visits as told by your health care provider. This is important.  If you had a tube placed to keep urine flowing (ureteral stent), ask your health care provider when you need to return to have it removed. Contact a health care provider if:  You have chills or a fever.  You have burning pain for longer than 24 hours after the procedure.  You have blood in your urine for longer than 24 hours after the procedure. Get help right away if:  You have large amounts of blood in your urine.  You have blood clots in your urine.  You have very bad pain.  You have chest pain or trouble breathing.  You are unable to urinate and you have the feeling of a full bladder. This information is not intended to replace advice given to you by your health care provider. Make sure you discuss any questions you have with your health  care provider. Document Released: 05/12/2013 Document Revised: 02/21/2016 Document Reviewed: 02/17/2016 Elsevier Interactive Patient Education  2017 [employer].

## 2016-07-26 NOTE — Anesthesia Preprocedure Evaluation (Addendum)
Anesthesia Evaluation  Patient identified by MRN, date of birth, ID band Patient awake  General Assessment Comment:History noted. CG  Reviewed: Allergy & Precautions, NPO status , Patient's Chart, lab work & pertinent test results  Airway Mallampati: I  TM Distance: >3 FB     Dental   Pulmonary neg pulmonary ROS,    breath sounds clear to auscultation       Cardiovascular negative cardio ROS   Rhythm:Regular Rate:Normal     Neuro/Psych    GI/Hepatic Neg liver ROS, GERD  ,  Endo/Other  negative endocrine ROS  Renal/GU negative Renal ROS     Musculoskeletal   Abdominal   Peds  Hematology   Anesthesia Other Findings   Reproductive/Obstetrics                             Anesthesia Physical  Anesthesia Plan  ASA: II  Anesthesia Plan: General   Post-op Pain Management:    Induction: Intravenous  Airway Management Planned: LMA  Additional Equipment:   Intra-op Plan:   Post-operative Plan: Extubation in OR  Informed Consent: I have reviewed the patients History and Physical, chart, labs and discussed the procedure including the risks, benefits and alternatives for the proposed anesthesia with the patient or authorized representative who has indicated his/her understanding and acceptance.   Dental advisory given  Plan Discussed with: CRNA and Anesthesiologist  Anesthesia Plan Comments:         Anesthesia Quick Evaluation  

## 2016-07-26 NOTE — Brief Op Note (Signed)
07/26/2016  12:16 PM  PATIENT:  Gwendolyn Salazar  26 y.o. female  PRE-OPERATIVE DIAGNOSIS:  GROSS HEMATURIA  POST-OPERATIVE DIAGNOSIS:  GROSS HEMATURIA  PROCEDURE:  Procedure(s): CYSTOSCOPY/  LEFT DIAGNOSTIC URETEROSCOPY//STENT PLACEMENT (Bilateral) CYSTOSCOPY WITH RETROGRADE PYELOGRAM (Bilateral)  SURGEON:  Surgeon(s) and Role:    * Malen GauzePatrick L Vista Sawatzky, MD - Primary  PHYSICIAN ASSISTANT:   ASSISTANTS: none   ANESTHESIA:   general  EBL:  Total I/O In: 1000 [I.V.:1000] Out: 0   BLOOD ADMINISTERED:none  DRAINS: bilateral 6x24 JJ ureteral stent without tether   LOCAL MEDICATIONS USED:  NONE  SPECIMEN:  No Specimen  DISPOSITION OF SPECIMEN:  N/A  COUNTS:  YES  TOURNIQUET:  * No tourniquets in log *  DICTATION: .Note written in EPIC  PLAN OF CARE: Discharge to home after PACU  PATIENT DISPOSITION:  PACU - hemodynamically stable.   Delay start of Pharmacological VTE agent (>24hrs) due to surgical blood loss or risk of bleeding: not applicable

## 2016-07-26 NOTE — H&P (Signed)
Urology Admission H&P  Chief Complaint: gross hematuria  History of Present Illness: Gwendolyn Salazar is a 25yo with a hx of gross hematuria and no obvious abnormality on CT scan and office cystosocpy. She endorses right flank pain when she has the hematuria episodes  Past Medical History:  Diagnosis Date  . Dysuria   . Frequency of urination   . GERD (gastroesophageal reflux disease)   . Gross hematuria   . Hypokalemia   . IBS (irritable bowel syndrome)   . Sickle cell trait (HCC)   . Wears glasses    Past Surgical History:  Procedure Laterality Date  . LAPAROSCOPIC CHOLECYSTECTOMY  09/10/2011    Home Medications:  Prescriptions Prior to Admission  Medication Sig Dispense Refill Last Dose  . potassium chloride (K-DUR) 10 MEQ tablet Take 1 tablet (10 mEq total) by mouth once. 4 tablet 0    Allergies: No Known Allergies  History reviewed. No pertinent family history. Social History:  reports that she has never smoked. She has never used smokeless tobacco. She reports that she does not drink alcohol or use drugs.  Review of Systems  Genitourinary: Positive for flank pain and hematuria.  All other systems reviewed and are negative.   Physical Exam:  Vital signs in last 24 hours: Temp:  [97.8 F (36.6 C)] 97.8 F (36.6 C) (03/08 0956) Pulse Rate:  [84] 84 (03/08 0956) Resp:  [14] 14 (03/08 0956) BP: (123)/(71) 123/71 (03/08 0956) SpO2:  [100 %] 100 % (03/08 0956) Weight:  [45.8 kg (101 lb)] 45.8 kg (101 lb) (03/08 0956) Physical Exam  Constitutional: She is oriented to person, place, and time. She appears well-developed and well-nourished.  HENT:  Head: Normocephalic and atraumatic.  Eyes: EOM are normal. Pupils are equal, round, and reactive to light.  Neck: Normal range of motion. No thyromegaly present.  Cardiovascular: Normal rate and regular rhythm.   Respiratory: Effort normal. No respiratory distress.  GI: Soft. She exhibits no distension.  Musculoskeletal: Normal  range of motion. She exhibits no edema.  Neurological: She is alert and oriented to person, place, and time.  Skin: Skin is warm and dry.  Psychiatric: She has a normal mood and affect. Her behavior is normal. Judgment and thought content normal.    Laboratory Data:  Results for orders placed or performed during the hospital encounter of 07/26/16 (from the past 24 hour(s))  I-STAT 4, (NA,K, GLUC, HGB,HCT)     Status: None   Collection Time: 07/26/16 10:30 AM  Result Value Ref Range   Sodium 142 135 - 145 mmol/L   Potassium 3.8 3.5 - 5.1 mmol/L   Glucose, Bld 80 65 - 99 mg/dL   HCT 41.0 36.0 - 46.0 %   Hemoglobin 13.9 12.0 - 15.0 g/dL  Pregnancy, urine POC     Status: None   Collection Time: 07/26/16 11:01 AM  Result Value Ref Range   Preg Test, Ur NEGATIVE NEGATIVE   No results found for this or any previous visit (from the past 240 hour(s)). Creatinine: No results for input(s): CREATININE in the last 168 hours. Baseline Creatinine: unknwon  Impression/Assessment:  25yo with gross hematuria  Plan:  The risks/benefits/alternatives to cystoscopy, bilateral retrograde and bilateral ureteroscopy with stent placement was explained to the patient and she understands and wishes to proceed with surgery  Cristian Grieves 07/26/2016, 11:33 AM      

## 2016-07-27 ENCOUNTER — Emergency Department (HOSPITAL_COMMUNITY)
Admission: EM | Admit: 2016-07-27 | Discharge: 2016-07-27 | Disposition: A | Discharge status: Home / Self Care | Payer: 59 | Attending: Emergency Medicine | Admitting: Emergency Medicine

## 2016-07-27 ENCOUNTER — Other Ambulatory Visit: Payer: Self-pay | Admitting: Urology

## 2016-07-27 ENCOUNTER — Encounter (HOSPITAL_COMMUNITY): Payer: Self-pay | Admitting: *Deleted

## 2016-07-27 DIAGNOSIS — Z9889 Other specified postprocedural states: Secondary | ICD-10-CM | POA: Insufficient documentation

## 2016-07-27 DIAGNOSIS — Z79899 Other long term (current) drug therapy: Secondary | ICD-10-CM | POA: Insufficient documentation

## 2016-07-27 DIAGNOSIS — R109 Unspecified abdominal pain: Secondary | ICD-10-CM | POA: Insufficient documentation

## 2016-07-27 DIAGNOSIS — G8918 Other acute postprocedural pain: Secondary | ICD-10-CM

## 2016-07-27 LAB — CBC WITH DIFFERENTIAL/PLATELET
Basophils Absolute: 0 10*3/uL (ref 0.0–0.1)
Basophils Relative: 0 %
Eosinophils Absolute: 0 10*3/uL (ref 0.0–0.7)
Eosinophils Relative: 0 %
HCT: 35.9 % — ABNORMAL LOW (ref 36.0–46.0)
Hemoglobin: 12.6 g/dL (ref 12.0–15.0)
Lymphocytes Relative: 12 %
Lymphs Abs: 1.1 10*3/uL (ref 0.7–4.0)
MCH: 30.1 pg (ref 26.0–34.0)
MCHC: 35.1 g/dL (ref 30.0–36.0)
MCV: 85.9 fL (ref 78.0–100.0)
Monocytes Absolute: 0.6 10*3/uL (ref 0.1–1.0)
Monocytes Relative: 7 %
Neutro Abs: 7.4 10*3/uL (ref 1.7–7.7)
Neutrophils Relative %: 81 %
Platelets: 203 10*3/uL (ref 150–400)
RBC: 4.18 MIL/uL (ref 3.87–5.11)
RDW: 12.8 % (ref 11.5–15.5)
WBC: 9.1 10*3/uL (ref 4.0–10.5)

## 2016-07-27 LAB — COMPREHENSIVE METABOLIC PANEL
ALT: 456 U/L — ABNORMAL HIGH (ref 14–54)
AST: 294 U/L — ABNORMAL HIGH (ref 15–41)
Albumin: 4.2 g/dL (ref 3.5–5.0)
Alkaline Phosphatase: 94 U/L (ref 38–126)
Anion gap: 14 (ref 5–15)
BUN: 5 mg/dL — ABNORMAL LOW (ref 6–20)
CO2: 21 mmol/L — ABNORMAL LOW (ref 22–32)
Calcium: 9.8 mg/dL (ref 8.9–10.3)
Chloride: 102 mmol/L (ref 101–111)
Creatinine, Ser: 0.87 mg/dL (ref 0.44–1.00)
GFR calc Af Amer: >60 mL/min (ref >60)
GFR calc non Af Amer: >60 mL/min (ref >60)
Glucose, Bld: 116 mg/dL — ABNORMAL HIGH (ref 65–99)
Potassium: 3.6 mmol/L (ref 3.5–5.1)
Sodium: 137 mmol/L (ref 135–145)
Total Bilirubin: 1.6 mg/dL — ABNORMAL HIGH (ref 0.3–1.2)
Total Protein: 7.5 g/dL (ref 6.5–8.1)

## 2016-07-27 LAB — URINALYSIS, ROUTINE W REFLEX MICROSCOPIC
Bilirubin Urine: NEGATIVE
Glucose, UA: 50 mg/dL — AB
Ketones, ur: 5 mg/dL — AB
Nitrite: NEGATIVE
Protein, ur: 100 mg/dL — AB
Specific Gravity, Urine: 1.006 (ref 1.005–1.030)
pH: 9 — ABNORMAL HIGH (ref 5.0–8.0)

## 2016-07-27 MED ORDER — PROMETHAZINE HCL 25 MG/ML IJ SOLN
25.0000 mg | Freq: Once | INTRAMUSCULAR | Status: AC
  Administered 2016-07-27: 25 mg via INTRAVENOUS
  Filled 2016-07-27: qty 1

## 2016-07-27 MED ORDER — PROMETHAZINE HCL 25 MG PO TABS: 25.0000 mg | ORAL_TABLET | Freq: Four times a day (QID) | ORAL | 0 refills | Status: DC | PRN

## 2016-07-27 MED ORDER — HYDROMORPHONE HCL 2 MG/ML IJ SOLN
1.0000 mg | Freq: Once | INTRAMUSCULAR | Status: AC
  Administered 2016-07-27: 1 mg via INTRAVENOUS
  Filled 2016-07-27: qty 1

## 2016-07-27 NOTE — ED Provider Notes (Signed)
[facility]-EMERGENCY DEPT Provider Note   CSN: [id number] Arrival date & time: 07/27/16  0434     History   Chief Complaint Chief Complaint  Patient presents with  . Post-op Problem    HPI Gwendolyn Salazar is a 26 y.o. female.  HPI Patient presents to the emergency department with flank pain.  The patient states that she was seen by her urologist and had a procedure performed today and also had stent placement in the left ureter, states that the pain became unbearable and was unable to keep any of the medications down.  She began to vomit she spoke with the on-call urologist, who advised her to come to the emergency department if her pain was not getting better.  Patient states that nothing seems make the condition better or worse. The patient denies chest pain, shortness of breath, headache,blurred vision, neck pain, fever, cough, weakness, numbness, dizziness, anorexia, edema,  diarrhea, rash, back pain, dysuria, hematemesis, bloody stool, near syncope, or syncope. Past Medical History:  Diagnosis Date  . Dysuria   . Frequency of urination   . GERD (gastroesophageal reflux disease)   . Gross hematuria   . Hypokalemia   . IBS (irritable bowel syndrome)   . Sickle cell trait (HCC)   . Wears glasses     There are no active problems to display for this patient.   Past Surgical History:  Procedure Laterality Date  . LAPAROSCOPIC CHOLECYSTECTOMY  09/10/2011    OB History    No data available       Home Medications    Prior to Admission medications   Medication Sig Start Date End Date Taking? Authorizing Provider  oxybutynin (DITROPAN XL) 10 MG 24 hr tablet Take 1 tablet (10 mg total) by mouth at bedtime. 07/26/16  Yes Malen GauzePatrick L McKenzie, MD  oxyCODONE-acetaminophen (ROXICET) 5-325 MG tablet Take 1 tablet by mouth every 4 (four) hours as needed for severe pain. 07/26/16  Yes Malen GauzePatrick L McKenzie, MD  phenazopyridine (PYRIDIUM) 100 MG tablet Take 1 tablet (100 mg total) by mouth 3  (three) times daily as needed for pain. 07/26/16  Yes Malen GauzePatrick L McKenzie, MD  tamsulosin (FLOMAX) 0.4 MG CAPS capsule Take 1 capsule (0.4 mg total) by mouth daily after supper. 07/26/16  Yes Malen GauzePatrick L McKenzie, MD    Family History No family history on file.  Social History Social History  Substance Use Topics  . Smoking status: Never Smoker  . Smokeless tobacco: Never Used  . Alcohol use No     Allergies   Patient has no known allergies.   Review of Systems Review of Systems All other systems negative except as documented in the HPI. All pertinent positives and negatives as reviewed in the HPI.  Physical Exam Updated Vital Signs BP 139/85 (BP Location: Right Arm)   Pulse 62   Temp 97.7 F (36.5 C) (Oral)   Resp 24   LMP 07/02/2016   SpO2 100%   Physical Exam  Constitutional: She is oriented to person, place, and time. She appears well-developed and well-nourished. No distress.  HENT:  Head: Normocephalic and atraumatic.  Mouth/Throat: Oropharynx is clear and moist.  Eyes: Pupils are equal, round, and reactive to light.  Neck: Normal range of motion. Neck supple.  Cardiovascular: Normal rate, regular rhythm and normal heart sounds.  Exam reveals no gallop and no friction rub.   No murmur heard. Pulmonary/Chest: Effort normal and breath sounds normal. No respiratory distress. She has no wheezes.  Abdominal: Soft. Bowel  sounds are normal. She exhibits no distension and no mass. There is tenderness. There is no rebound and no guarding.  Neurological: She is alert and oriented to person, place, and time. She exhibits normal muscle tone. Coordination normal.  Skin: Skin is warm and dry. Capillary refill takes less than 2 seconds. No rash noted. No erythema.  Psychiatric: She has a normal mood and affect. Her behavior is normal.  Nursing note and vitals reviewed.    ED Treatments / Results  Labs (all labs ordered are listed, but only abnormal results are displayed) Labs  Reviewed  CBC WITH DIFFERENTIAL/PLATELET - Abnormal; Notable for the following:       Result Value   HCT 35.9 (*)    All other components within normal limits  COMPREHENSIVE METABOLIC PANEL  URINALYSIS, ROUTINE W REFLEX MICROSCOPIC    EKG  EKG Interpretation  Date/Time:  Friday July 27 2016 04:42:29 EST Ventricular Rate:  54 PR Interval:    QRS Duration: 99 QT Interval:  432 QTC Calculation: 410 R Axis:   79 Text Interpretation:  Sinus rhythm Short PR interval No old tracing to compare Confirmed by WARD,  DO, KRISTEN ([id number]) on 07/27/2016 4:55:05 AM       Radiology No results found.  Procedures Procedures (including critical care time)  Medications Ordered in ED Medications  HYDROmorphone (DILAUDID) injection 1 mg (1 mg Intravenous Given 07/27/16 0507)  promethazine (PHENERGAN) injection 25 mg (25 mg Intravenous Given 07/27/16 0507)     Initial Impression / Assessment and Plan / ED Course  I have reviewed the triage vital signs and the nursing notes.  Pertinent labs & imaging results that were available during my care of the patient were reviewed by me and considered in my medical decision making (see chart for details).     After pain medication and antiemetics.  Patient has complete resolution of her symptoms.  I spoke with the on-call urologist, Dr. Mena Goes, who advised that the patient's laboratory testing was normal and the patient's pain controlled, then she could be discharged home and follow up with urology  Final Clinical Impressions(s) / ED Diagnoses   Final diagnoses:  None    New Prescriptions New Prescriptions   No medications on file     Charlestine Night, PA-C 07/27/16 0623    Layla Maw Ward, DO 07/27/16 [phone]

## 2016-07-27 NOTE — Discharge Instructions (Signed)
Return here as needed. Follow up by calling your urologist. Increase your fluid intake.

## 2016-07-27 NOTE — ED Triage Notes (Signed)
Pt had a urinary stent placed on Thursday due to having blood in her urine. Pt c/o NV and increased pain. Was given pain medications, unable to keep medications down

## 2016-08-03 ENCOUNTER — Encounter (HOSPITAL_BASED_OUTPATIENT_CLINIC_OR_DEPARTMENT_OTHER): Payer: Self-pay | Admitting: *Deleted

## 2016-08-03 NOTE — Progress Notes (Signed)
NPO AFTER MN W/ EXCEPTION CLEAR LIQUIDS UNTIL 0700 (NO CREAM /MILK PRODUCTS).  ARRIVE AT 1130.  CURRENT LAB RESULTS IN CHART AND EPIC.  MAY TAKE PRN RX MEDS IF NEEDED AM DOS W/ SIPS OF WATER.

## 2016-08-06 NOTE — Op Note (Signed)
.  Preoperative diagnosis: gross hematuria  Postoperative diagnosis: Same  Procedure: 1 cystoscopy 2. Bilateral retrograde pyelography 3.  Intraoperative fluoroscopy, under one hour, with interpretation 4.  left diagnostic ureteroscopy 5.  bilateral 6 x 24 JJ stent placement  Attending: PaCleda Mccreedytrick Mackenzie  Anesthesia: General  Estimated blood loss: None  Drains: bilateral 6 x 24 JJ ureteral stent without tether  Specimens: none  Antibiotics: ancef  Findings: Normal bilateral retrograde pyelograms. Blood effluxing from left ureteral orifice. No masses/lesions in the bladder. Ureteral orifices in normal anatomic location.  Indications: Patient is a 26 year old female with a history of gross hematuria. After discussing treatment options, they decided proceed with bilateral diagnostic ureteroscopy  Procedure her in detail: The patient was brought to the operating room and a brief timeout was done to ensure correct patient, correct procedure, correct site.  General anesthesia was administered patient was placed in dorsal lithotomy position.  Her genitalia was then prepped and draped in usual sterile fashion.  A rigid 22 French cystoscope was passed in the urethra and the bladder.  Bladder was inspected free masses or lesions.  the ureteral orifices were in the normal orthotopic locations. a 6 french ureteral catheter was then instilled into the left ureteral orifice.  a gentle retrograde was obtained and findings noted above. We then advanced a zipwrie up to the renal pelvis. we then removed the cystoscope and cannulated the left ureteral orifice with a semirigid ureteroscope.  We advanced the scope up to the pelvic brim but due to the small caliber of the ureter we could not advance the scope further. We elected to place a stent. We then placed a 6 x 24 double-j ureteral stent over the original zip wire. We then removed the wire and good coil was noted in the the renal pelvis under fluoroscopy and  the bladder under direct vision.  We then turned out attention to the right side. a 6 french ureteral catheter was then instilled into the right ureteral orifice.  a gentle retrograde was obtained and findings noted above. We then advanced a zipwire up to the renal pelvis. we then placed a 6 x 24 double-j ureteral stent over the original zip wire.  We then removed the wire and good coil was noted in the the renal pelvis under fluoroscopy and the bladder under direct vision.  the bladder was then drained and this concluded the procedure which was well tolerated by patient.  Complications: None  Condition: Stable, extubated, transferred to PACU  Plan: Patient is to be discharged home as to follow-up in 2 weeks for diagnostic ureteroscopy

## 2016-08-10 ENCOUNTER — Encounter (HOSPITAL_BASED_OUTPATIENT_CLINIC_OR_DEPARTMENT_OTHER)
Admission: RE | Disposition: A | Discharge status: Home / Self Care | Payer: Self-pay | Source: Ambulatory Visit | Attending: Urology

## 2016-08-10 ENCOUNTER — Ambulatory Visit (HOSPITAL_BASED_OUTPATIENT_CLINIC_OR_DEPARTMENT_OTHER): Payer: 59 | Admitting: Anesthesiology

## 2016-08-10 ENCOUNTER — Ambulatory Visit (HOSPITAL_BASED_OUTPATIENT_CLINIC_OR_DEPARTMENT_OTHER)
Admission: RE | Admit: 2016-08-10 | Discharge: 2016-08-10 | Disposition: A | Discharge status: Home / Self Care | Payer: 59 | Source: Ambulatory Visit | Attending: Urology | Admitting: Urology

## 2016-08-10 ENCOUNTER — Encounter (HOSPITAL_BASED_OUTPATIENT_CLINIC_OR_DEPARTMENT_OTHER): Payer: Self-pay

## 2016-08-10 DIAGNOSIS — D573 Sickle-cell trait: Secondary | ICD-10-CM | POA: Insufficient documentation

## 2016-08-10 DIAGNOSIS — R310 Gross hematuria: Secondary | ICD-10-CM | POA: Insufficient documentation

## 2016-08-10 DIAGNOSIS — Q2739 Arteriovenous malformation, other site: Secondary | ICD-10-CM | POA: Insufficient documentation

## 2016-08-10 HISTORY — PX: CYSTOSCOPY WITH RETROGRADE PYELOGRAM, URETEROSCOPY AND STENT PLACEMENT: SHX5789

## 2016-08-10 HISTORY — PX: CYSTOSCOPY WITH FULGERATION: SHX6638

## 2016-08-10 HISTORY — PX: HOLMIUM LASER APPLICATION: SHX5852

## 2016-08-10 SURGERY — CYSTOURETEROSCOPY, WITH RETROGRADE PYELOGRAM AND STENT INSERTION
Anesthesia: General | Site: Ureter | Laterality: Bilateral

## 2016-08-10 MED ORDER — MIDAZOLAM HCL 2 MG/2ML IJ SOLN
INTRAMUSCULAR | Status: AC
  Filled 2016-08-10: qty 2

## 2016-08-10 MED ORDER — FENTANYL CITRATE (PF) 100 MCG/2ML IJ SOLN
INTRAMUSCULAR | Status: DC | PRN
  Administered 2016-08-10 (×4): 25 ug via INTRAVENOUS

## 2016-08-10 MED ORDER — PROPOFOL 10 MG/ML IV BOLUS
INTRAVENOUS | Status: AC
  Filled 2016-08-10: qty 20

## 2016-08-10 MED ORDER — KETOROLAC TROMETHAMINE 30 MG/ML IJ SOLN
INTRAMUSCULAR | Status: AC
  Filled 2016-08-10: qty 1

## 2016-08-10 MED ORDER — KETOROLAC TROMETHAMINE 30 MG/ML IJ SOLN
INTRAMUSCULAR | Status: DC | PRN
  Administered 2016-08-10: 30 mg via INTRAVENOUS

## 2016-08-10 MED ORDER — CEFAZOLIN SODIUM-DEXTROSE 2-4 GM/100ML-% IV SOLN
2.0000 g | INTRAVENOUS | Status: AC
Start: 2016-08-10 — End: 2016-08-10
  Administered 2016-08-10: 2 g via INTRAVENOUS
  Filled 2016-08-10: qty 100

## 2016-08-10 MED ORDER — LACTATED RINGERS IV SOLN
INTRAVENOUS | Status: DC
  Administered 2016-08-10 (×2): via INTRAVENOUS
  Filled 2016-08-10: qty 1000

## 2016-08-10 MED ORDER — PROMETHAZINE HCL 25 MG PO TABS: 25.0000 mg | ORAL_TABLET | Freq: Four times a day (QID) | ORAL | 0 refills | Status: AC | PRN

## 2016-08-10 MED ORDER — SODIUM CHLORIDE 0.9 % IR SOLN
Status: DC | PRN
  Administered 2016-08-10: 4000 mL

## 2016-08-10 MED ORDER — KETOROLAC TROMETHAMINE 30 MG/ML IJ SOLN
INTRAMUSCULAR | Status: AC
  Filled 2016-08-10: qty 2

## 2016-08-10 MED ORDER — OXYCODONE-ACETAMINOPHEN 5-325 MG PO TABS: 1.0000 | ORAL_TABLET | ORAL | 0 refills | Status: AC | PRN

## 2016-08-10 MED ORDER — PROPOFOL 10 MG/ML IV BOLUS
INTRAVENOUS | Status: DC | PRN
  Administered 2016-08-10: 110 mg via INTRAVENOUS

## 2016-08-10 MED ORDER — DEXAMETHASONE SODIUM PHOSPHATE 4 MG/ML IJ SOLN
INTRAMUSCULAR | Status: DC | PRN
  Administered 2016-08-10: 10 mg via INTRAVENOUS

## 2016-08-10 MED ORDER — ACETAMINOPHEN 160 MG/5ML PO SOLN
ORAL | Status: AC
  Filled 2016-08-10: qty 40.6

## 2016-08-10 MED ORDER — OXYBUTYNIN CHLORIDE ER 10 MG PO TB24: 10.0000 mg | ORAL_TABLET | Freq: Every day | ORAL | 0 refills | Status: AC

## 2016-08-10 MED ORDER — IOPAMIDOL (ISOVUE-370) INJECTION 76%
INTRAVENOUS | Status: DC | PRN
  Administered 2016-08-10: 12 mL

## 2016-08-10 MED ORDER — LIDOCAINE 2% (20 MG/ML) 5 ML SYRINGE
INTRAMUSCULAR | Status: DC | PRN
Start: 2016-08-10 — End: 2016-08-13
  Administered 2016-08-10: 50 mg via INTRAVENOUS

## 2016-08-10 MED ORDER — CEFAZOLIN SODIUM-DEXTROSE 2-4 GM/100ML-% IV SOLN
INTRAVENOUS | Status: AC
  Filled 2016-08-10: qty 100

## 2016-08-10 MED ORDER — ONDANSETRON HCL 4 MG/2ML IJ SOLN
INTRAMUSCULAR | Status: DC | PRN
  Administered 2016-08-10: 4 mg via INTRAVENOUS

## 2016-08-10 MED ORDER — DEXAMETHASONE SODIUM PHOSPHATE 10 MG/ML IJ SOLN
INTRAMUSCULAR | Status: AC
  Filled 2016-08-10: qty 6

## 2016-08-10 MED ORDER — FENTANYL CITRATE (PF) 100 MCG/2ML IJ SOLN
25.0000 ug | INTRAMUSCULAR | Status: DC | PRN
  Filled 2016-08-10: qty 1

## 2016-08-10 MED ORDER — DEXAMETHASONE SODIUM PHOSPHATE 10 MG/ML IJ SOLN
INTRAMUSCULAR | Status: AC
  Filled 2016-08-10: qty 1

## 2016-08-10 MED ORDER — LIDOCAINE 2% (20 MG/ML) 5 ML SYRINGE
INTRAMUSCULAR | Status: AC
  Filled 2016-08-10: qty 5

## 2016-08-10 MED ORDER — ACETAMINOPHEN 160 MG/5ML PO SOLN
960.0000 mg | Freq: Once | ORAL | Status: AC
  Administered 2016-08-10: 1000 mg via ORAL
  Filled 2016-08-10: qty 40.6

## 2016-08-10 MED ORDER — MIDAZOLAM HCL 5 MG/5ML IJ SOLN
INTRAMUSCULAR | Status: DC | PRN
  Administered 2016-08-10: 2 mg via INTRAVENOUS

## 2016-08-10 MED ORDER — ONDANSETRON HCL 4 MG/2ML IJ SOLN
INTRAMUSCULAR | Status: AC
  Filled 2016-08-10: qty 2

## 2016-08-10 MED ORDER — CEFAZOLIN IN D5W 1 GM/50ML IV SOLN
1.0000 g | INTRAVENOUS | Status: DC
  Filled 2016-08-10: qty 50

## 2016-08-10 MED ORDER — FENTANYL CITRATE (PF) 100 MCG/2ML IJ SOLN
INTRAMUSCULAR | Status: AC
  Filled 2016-08-10: qty 2

## 2016-08-10 SURGICAL SUPPLY — 30 items
BAG DRAIN URO-CYSTO SKYTR STRL (DRAIN) ×5 IMPLANT
BASKET DAKOTA 1.9FR 11X120 (BASKET) IMPLANT
CATH INTERMIT  6FR 70CM (CATHETERS) ×5 IMPLANT
CLOTH BEACON ORANGE TIMEOUT ST (SAFETY) ×5 IMPLANT
EVACUATOR MICROVAS BLADDER (UROLOGICAL SUPPLIES) IMPLANT
FIBER LASER FLEXIVA 1000 (UROLOGICAL SUPPLIES) IMPLANT
FIBER LASER FLEXIVA 200 (UROLOGICAL SUPPLIES) IMPLANT
FIBER LASER FLEXIVA 365 (UROLOGICAL SUPPLIES) IMPLANT
FIBER LASER FLEXIVA 550 (UROLOGICAL SUPPLIES) IMPLANT
FIBER LASER TRAC TIP (UROLOGICAL SUPPLIES) ×5 IMPLANT
GLOVE BIO SURGEON STRL SZ8 (GLOVE) ×5 IMPLANT
GLOVE BIOGEL PI IND STRL 6.5 (GLOVE) ×6 IMPLANT
GLOVE BIOGEL PI INDICATOR 6.5 (GLOVE) ×4
GOWN STRL REUS W/ TWL LRG LVL3 (GOWN DISPOSABLE) ×3 IMPLANT
GOWN STRL REUS W/ TWL XL LVL3 (GOWN DISPOSABLE) IMPLANT
GOWN STRL REUS W/TWL LRG LVL3 (GOWN DISPOSABLE) ×2
GOWN STRL REUS W/TWL XL LVL3 (GOWN DISPOSABLE) ×5 IMPLANT
GUIDEWIRE ANG ZIPWIRE 038X150 (WIRE) ×5 IMPLANT
GUIDEWIRE STR DUAL SENSOR (WIRE) ×5 IMPLANT
IV NS 1000ML (IV SOLUTION) ×2
IV NS 1000ML BAXH (IV SOLUTION) ×3 IMPLANT
IV NS IRRIG 3000ML ARTHROMATIC (IV SOLUTION) ×5 IMPLANT
KIT RM TURNOVER CYSTO AR (KITS) ×5 IMPLANT
MANIFOLD NEPTUNE II (INSTRUMENTS) ×5 IMPLANT
PACK CYSTO (CUSTOM PROCEDURE TRAY) ×5 IMPLANT
STENT URET 6FRX26 CONTOUR (STENTS) IMPLANT
SYRINGE 10CC LL (SYRINGE) ×5 IMPLANT
TUBE CONNECTING 12'X1/4 (SUCTIONS) ×1
TUBE CONNECTING 12X1/4 (SUCTIONS) ×4 IMPLANT
TUBE FEEDING 8FR 16IN STR KANG (MISCELLANEOUS) IMPLANT

## 2016-08-10 NOTE — Interval H&P Note (Signed)
History and Physical Interval Note:  08/10/2016 1:23 PM  Gwendolyn Salazar  has presented today for surgery, with the diagnosis of GROSS HEMATURIA  The various methods of treatment have been discussed with the patient and family. After consideration of risks, benefits and other options for treatment, the patient has consented to  Procedure(s): CYSTOSCOPY WITH RETROGRADE PYELOGRAM, URETEROSCOPY AND STENT REPLACEMENT (Bilateral) HOLMIUM LASER APPLICATION (Bilateral) as a surgical intervention .  The patient's history has been reviewed, patient examined, no change in status, stable for surgery.  I have reviewed the patient's chart and labs.  Questions were answered to the patient's satisfaction.     Wilkie AyePatrick Kirat Mezquita

## 2016-08-10 NOTE — Anesthesia Procedure Notes (Signed)
Procedure Name: LMA Insertion Date/Time: 08/10/2016 1:58 PM Performed by: Tyrone NineSAUVE, Nabeeha Badertscher F Pre-anesthesia Checklist: Patient identified, Timeout performed, Emergency Drugs available, Suction available and Patient being monitored Patient Re-evaluated:Patient Re-evaluated prior to inductionOxygen Delivery Method: Circle system utilized Preoxygenation: Pre-oxygenation with 100% oxygen Intubation Type: IV induction Ventilation: Mask ventilation without difficulty LMA: LMA inserted LMA Size: 4.0 Number of attempts: 1 Airway Equipment and Method: Bite block Placement Confirmation: positive ETCO2 and breath sounds checked- equal and bilateral Tube secured with: Tape Dental Injury: Teeth and Oropharynx as per pre-operative assessment

## 2016-08-10 NOTE — Discharge Instructions (Signed)
Ureteroscopy, Care After This sheet gives you information about how to care for yourself after your procedure. Your health care provider may also give you more specific instructions. If you have problems or questions, contact your health care provider. What can I expect after the procedure? After the procedure, it is common to have:  A burning sensation when you urinate.  Blood in your urine.  Mild discomfort in the bladder area or kidney area when urinating.  Needing to urinate more often or urgently. Follow these instructions at home: Medicines   Take over-the-counter and prescription medicines only as told by your health care provider.  If you were prescribed an antibiotic medicine, take it as told by your health care provider. Do not stop taking the antibiotic even if you start to feel better. General instructions    Donot drive for 24 hours if you were given a medicine to help you relax (sedative) during your procedure.  To relieve burning, try taking a warm bath or holding a warm washcloth over your groin.  Drink enough fluid to keep your urine clear or pale yellow.  Drink two 8-ounce glasses of water every hour for the first 2 hours after you get home.  Continue to drink water often at home.  You can eat what you usually do.  Keep all follow-up visits as told by your health care provider. This is important.  If you had a tube placed to keep urine flowing (ureteral stent), ask your health care provider when you need to return to have it removed. Contact a health care provider if:  You have chills or a fever.  You have burning pain for longer than 24 hours after the procedure.  You have blood in your urine for longer than 24 hours after the procedure. Get help right away if:  You have large amounts of blood in your urine.  You have blood clots in your urine.  You have very bad pain.  You have chest pain or trouble breathing.  You are unable to urinate and  you have the feeling of a full bladder. This information is not intended to replace advice given to you by your health care provider. Make sure you discuss any questions you have with your health care provider. Document Released: 05/12/2013 Document Revised: 02/21/2016 Document Reviewed: 02/17/2016 Elsevier Interactive Patient Education  2017 [employer]  Post Anesthesia Home Care Instructions  Activity: Get plenty of rest for the remainder of the day. A responsible individual must stay with you for 24 hours following the procedure.  For the next 24 hours, DO NOT: -Drive a car -Advertising copywriterperate machinery -Drink alcoholic beverages -Take any medication unless instructed by your physician -Make any legal decisions or sign important papers.  Meals: Start with liquid foods such as gelatin or soup. Progress to regular foods as tolerated. Avoid greasy, spicy, heavy foods. If nausea and/or vomiting occur, drink only clear liquids until the nausea and/or vomiting subsides. Call your physician if vomiting continues.  Special Instructions/Symptoms: Your throat may feel dry or sore from the anesthesia or the breathing tube placed in your throat during surgery. If this causes discomfort, gargle with warm salt water. The discomfort should disappear within 24 hours.  If you had a scopolamine patch placed behind your ear for the management of post- operative nausea and/or vomiting:  1. The medication in the patch is effective for 72 hours, after which it should be removed.  Wrap patch in a tissue and discard in the trash.  Wash hands thoroughly with soap and water. °2. You may remove the patch earlier than 72 hours if you experience unpleasant side effects which may include dry mouth, dizziness or visual disturbances. °3. Avoid touching the patch. Wash your hands with soap and water after contact with the patch. °  ° °

## 2016-08-10 NOTE — Brief Op Note (Signed)
08/10/2016  2:45 PM  PATIENT:  Gwendolyn Salazar  26 y.o. female  PRE-OPERATIVE DIAGNOSIS:  GROSS HEMATURIA  POST-OPERATIVE DIAGNOSIS:  GROSS HEMATURIA  PROCEDURE:  Procedure(s): CYSTOSCOPY WITH RETROGRADE PYELOGRAM, URETEROSCOPY WITH ULGERATION AVM (Bilateral) HOLMIUM LASER APPLICATION (Bilateral) CYSTOSCOPY WITH FULGERATION  SURGEON:  Surgeon(s) and Role:    * Malen GauzePatrick L McKenzie, MD - Primary  PHYSICIAN ASSISTANT:   ASSISTANTS: none   ANESTHESIA:   general  EBL:  Total I/O In: 1000 [I.V.:1000] Out: 25 [Blood:25]  BLOOD ADMINISTERED:none  DRAINS: none   LOCAL MEDICATIONS USED:  NONE  SPECIMEN:  No Specimen  DISPOSITION OF SPECIMEN:  N/A  COUNTS:  YES  TOURNIQUET:  * No tourniquets in log *  DICTATION: .Note written in EPIC  PLAN OF CARE: Discharge to home after PACU  PATIENT DISPOSITION:  PACU - hemodynamically stable.   Delay start of Pharmacological VTE agent (>24hrs) due to surgical blood loss or risk of bleeding: not applicable

## 2016-08-10 NOTE — Anesthesia Preprocedure Evaluation (Signed)
Anesthesia Evaluation  Patient identified by MRN, date of birth, ID band Patient awake  General Assessment Comment:History noted. CG  Reviewed: Allergy & Precautions, NPO status , Patient's Chart, lab work & pertinent test results  Airway Mallampati: I  TM Distance: >3 FB     Dental   Pulmonary neg pulmonary ROS,    breath sounds clear to auscultation       Cardiovascular negative cardio ROS   Rhythm:Regular Rate:Normal     Neuro/Psych    GI/Hepatic Neg liver ROS, GERD  ,  Endo/Other  negative endocrine ROS  Renal/GU negative Renal ROS     Musculoskeletal   Abdominal   Peds  Hematology   Anesthesia Other Findings   Reproductive/Obstetrics                             Anesthesia Physical  Anesthesia Plan  ASA: II  Anesthesia Plan: General   Post-op Pain Management:    Induction: Intravenous  Airway Management Planned: LMA  Additional Equipment:   Intra-op Plan:   Post-operative Plan: Extubation in OR  Informed Consent: I have reviewed the patients History and Physical, chart, labs and discussed the procedure including the risks, benefits and alternatives for the proposed anesthesia with the patient or authorized representative who has indicated his/her understanding and acceptance.   Dental advisory given  Plan Discussed with: CRNA and Anesthesiologist  Anesthesia Plan Comments:         Anesthesia Quick Evaluation

## 2016-08-10 NOTE — H&P (View-Only) (Signed)
Urology Admission H&P  Chief Complaint: gross hematuria  History of Present Illness: Ms Gwendolyn Salazar is a 25yo with a hx of gross hematuria and no obvious abnormality on CT scan and office cystosocpy. She endorses right flank pain when she has the hematuria episodes  Past Medical History:  Diagnosis Date  . Dysuria   . Frequency of urination   . GERD (gastroesophageal reflux disease)   . Gross hematuria   . Hypokalemia   . IBS (irritable bowel syndrome)   . Sickle cell trait (HCC)   . Wears glasses    Past Surgical History:  Procedure Laterality Date  . LAPAROSCOPIC CHOLECYSTECTOMY  09/10/2011    Home Medications:  Prescriptions Prior to Admission  Medication Sig Dispense Refill Last Dose  . potassium chloride (K-DUR) 10 MEQ tablet Take 1 tablet (10 mEq total) by mouth once. 4 tablet 0    Allergies: No Known Allergies  History reviewed. No pertinent family history. Social History:  reports that she has never smoked. She has never used smokeless tobacco. She reports that she does not drink alcohol or use drugs.  Review of Systems  Genitourinary: Positive for flank pain and hematuria.  All other systems reviewed and are negative.   Physical Exam:  Vital signs in last 24 hours: Temp:  [97.8 F (36.6 C)] 97.8 F (36.6 C) (03/08 0956) Pulse Rate:  [84] 84 (03/08 0956) Resp:  [14] 14 (03/08 0956) BP: (123)/(71) 123/71 (03/08 0956) SpO2:  [100 %] 100 % (03/08 0956) Weight:  [45.8 kg (101 lb)] 45.8 kg (101 lb) (03/08 0956) Physical Exam  Constitutional: She is oriented to person, place, and time. She appears well-developed and well-nourished.  HENT:  Head: Normocephalic and atraumatic.  Eyes: EOM are normal. Pupils are equal, round, and reactive to light.  Neck: Normal range of motion. No thyromegaly present.  Cardiovascular: Normal rate and regular rhythm.   Respiratory: Effort normal. No respiratory distress.  GI: Soft. She exhibits no distension.  Musculoskeletal: Normal  range of motion. She exhibits no edema.  Neurological: She is alert and oriented to person, place, and time.  Skin: Skin is warm and dry.  Psychiatric: She has a normal mood and affect. Her behavior is normal. Judgment and thought content normal.    Laboratory Data:  Results for orders placed or performed during the hospital encounter of 07/26/16 (from the past 24 hour(s))  I-STAT 4, (NA,K, GLUC, HGB,HCT)     Status: None   Collection Time: 07/26/16 10:30 AM  Result Value Ref Range   Sodium 142 135 - 145 mmol/L   Potassium 3.8 3.5 - 5.1 mmol/L   Glucose, Bld 80 65 - 99 mg/dL   HCT 40.941.0 81.136.0 - 91.446.0 %   Hemoglobin 13.9 12.0 - 15.0 g/dL  Pregnancy, urine POC     Status: None   Collection Time: 07/26/16 11:01 AM  Result Value Ref Range   Preg Test, Ur NEGATIVE NEGATIVE   No results found for this or any previous visit (from the past 240 hour(s)). Creatinine: No results for input(s): CREATININE in the last 168 hours. Baseline Creatinine: unknwon  Impression/Assessment:  25yo with gross hematuria  Plan:  The risks/benefits/alternatives to cystoscopy, bilateral retrograde and bilateral ureteroscopy with stent placement was explained to the patient and she understands and wishes to proceed with surgery  Wilkie AyePatrick Joscelynn Brutus 07/26/2016, 11:33 AM

## 2016-08-10 NOTE — Anesthesia Postprocedure Evaluation (Addendum)
Anesthesia Post Note  Patient: Gwendolyn Salazar  Procedure(s) Performed: Procedure(s) (LRB): CYSTOSCOPY WITH RETROGRADE PYELOGRAM, URETEROSCOPY WITH ULGERATION AVM (Bilateral) HOLMIUM LASER APPLICATION (Bilateral) CYSTOSCOPY WITH FULGERATION  Patient location during evaluation: PACU Anesthesia Type: General Level of consciousness: awake and alert Pain management: pain level controlled Vital Signs Assessment: post-procedure vital signs reviewed and stable Respiratory status: spontaneous breathing, nonlabored ventilation, respiratory function stable and patient connected to nasal cannula oxygen Cardiovascular status: blood pressure returned to baseline and stable Postop Assessment: no signs of nausea or vomiting Anesthetic complications: no       Last Vitals:  Vitals:   08/10/16 1540 08/10/16 1630  BP:  115/61  Pulse: 67 75  Resp: 15 16  Temp:  36.7 C    Last Pain:  Vitals:   08/10/16 1454  TempSrc:   PainSc: Asleep                 Shelton SilvasKevin D Hollis

## 2016-08-10 NOTE — Transfer of Care (Signed)
Immediate Anesthesia Transfer of Care Note  Patient: Gwendolyn FinlayLauren Salazar  Procedure(s) Performed: Procedure(s): CYSTOSCOPY WITH RETROGRADE PYELOGRAM, URETEROSCOPY WITH ULGERATION AVM (Bilateral) HOLMIUM LASER APPLICATION (Bilateral) CYSTOSCOPY WITH FULGERATION  Patient Location: PACU  Anesthesia Type:General  Level of Consciousness: awake, alert , oriented and patient cooperative  Airway & Oxygen Therapy: Patient Spontanous Breathing and Patient connected to nasal cannula oxygen  Post-op Assessment: Report given to RN and Post -op Vital signs reviewed and stable  Post vital signs: Reviewed and stable  Last Vitals:  Vitals:   08/10/16 1122  BP: 120/73  Pulse: 75  Resp: 14  Temp: 36.9 C    Last Pain:  Vitals:   08/10/16 1122  TempSrc: Oral         Complications: No apparent anesthesia complications

## 2016-08-13 ENCOUNTER — Encounter (HOSPITAL_BASED_OUTPATIENT_CLINIC_OR_DEPARTMENT_OTHER): Payer: Self-pay | Admitting: Urology

## 2016-08-15 NOTE — Op Note (Signed)
.  Preoperative diagnosis: gross hematuria  Postoperative diagnosis: Same  Procedure: 1 cystoscopy 2. Bilateral retrograde pyelography 3.  Intraoperative fluoroscopy, under one hour, with interpretation 4.  fulgeration of right AVM   Attending: Cleda MccreedyPatrick Mackenzie  Anesthesia: General  Estimated blood loss: None  Drains: none  Specimens: none  Antibiotics: rocephin  Findings: Right mid pole arteriovenous malformation.no hydronephrosis bilaterally. No masses/lesions in the bladder. Ureteral orifices in normal anatomic location.  Indications: Patient is a 26 year old female with a history of gross hematuria and concern for AVM. After discussing treatment options, they decided proceed with bilateral ureteroscopy with possible fulgeration.  Procedure her in detail: The patient was brought to the operating room and a brief timeout was done to ensure correct patient, correct procedure, correct site.  General anesthesia was administered patient was placed in dorsal lithotomy position.  Her genitalia was then prepped and draped in usual sterile fashion.  A rigid 22 French cystoscope was passed in the urethra and the bladder.  Bladder was inspected free masses or lesions.  the ureteral orifices were in the normal orthotopic locations. Using a grasper the ureteral stent was brought to the urethral meatus. Through the stent a zipwire was advanced up to the renal pelvis. The stent was then removed. a 6 french ureteral catheter was then instilled into the left ureteral orifice.  a gentle retrograde was obtained and findings noted above.  we then removed the cystoscope and cannulated the left ureteral orifice with a flexible ureteroscope.  We performed ureteroscopy and nephroscopy and noted no abnormalities.  We then turned out attention to the right side. Using a grasper the ureteral stent was brought to the urethral meatus. Through the stent a zipwire was advanced up to the renal pelvis. The stent was then  removed. a 6 french ureteral catheter was then instilled into the right ureteral orifice.  a gentle retrograde was obtained and findings noted above.  we then removed the cystoscope and cannulated the right ureteral orifice with a flexible ureteroscope.  On nephroscopy we noted an AVM in the mid pole. Using a 200nm laser fiber the AVM was fulgerated to obtain hemostasis      the bladder was then drained and this concluded the procedure which was well tolerated by patient.  Complications: None  Condition: Stable, extubated, transferred to PACU  Plan: Patient is to be discharged home as to follow-up in 2 weeks.

## 2016-10-20 NOTE — Addendum Note (Signed)
Addendum  created 10/20/16 1026 by Saphia Vanderford, MD   Sign clinical note    

## 2018-04-20 IMAGING — CT CT ABD-PELV W/ CM
2 of 4 series · 11 of 46 positions shown, 12 images · IV contrast (Iodine)
Comparison: None.

CLINICAL DATA: Right lower quadrant abdominal pain. Hematuria.
Nausea and vomiting.

EXAM:
CT ABDOMEN AND PELVIS WITH CONTRAST
TECHNIQUE: Multidetector CT imaging of the abdomen and pelvis was performed
using the standard protocol following bolus administration of
intravenous contrast.
CONTRAST:  100mL [id number] IOPAMIDOL ([id number]) INJECTION 61%

[Series 201: routine, idose (2) · axial · 0.78mm/px · z∈[-561,-216]mm · 8 of 85 slices shown, 9 images]
[im 8/85  soft-tissue]
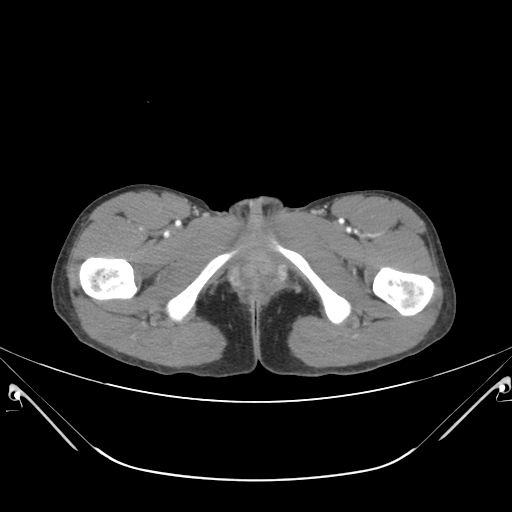
[im 8/85  bone]
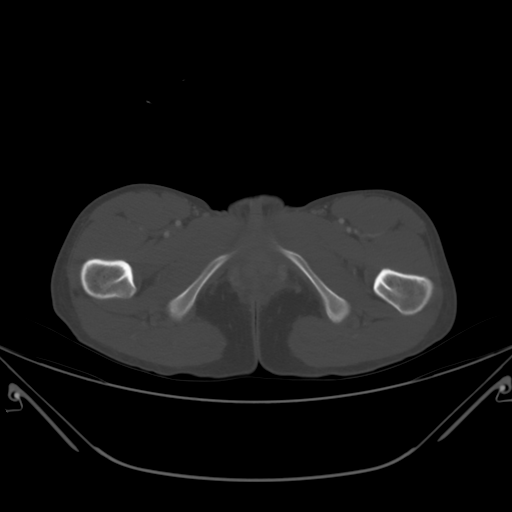
[im 19/85  soft-tissue]
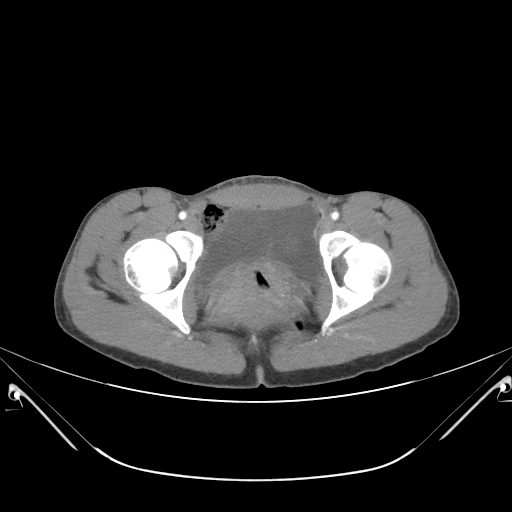
[im 26/85  soft-tissue]
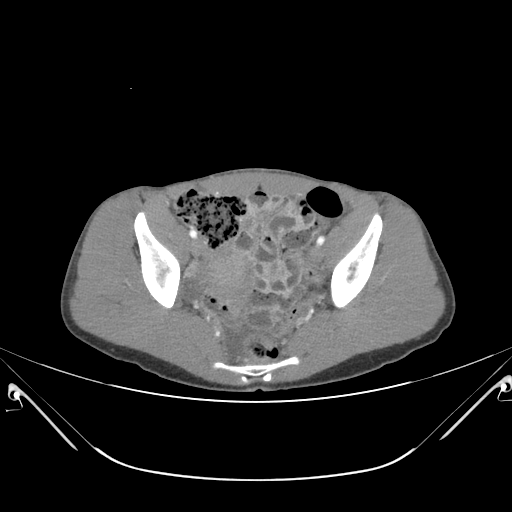
[im 37/85  soft-tissue]
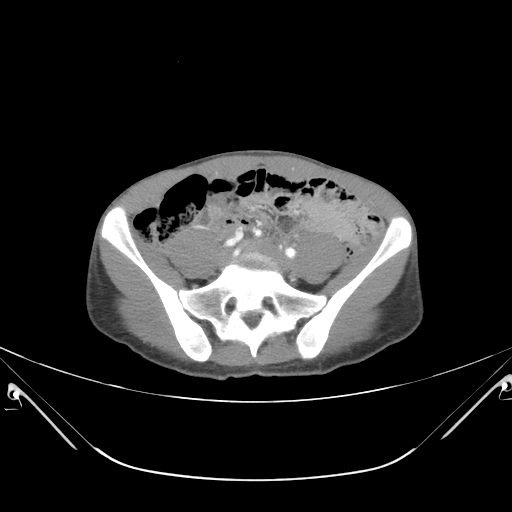
[im 48/85  soft-tissue]
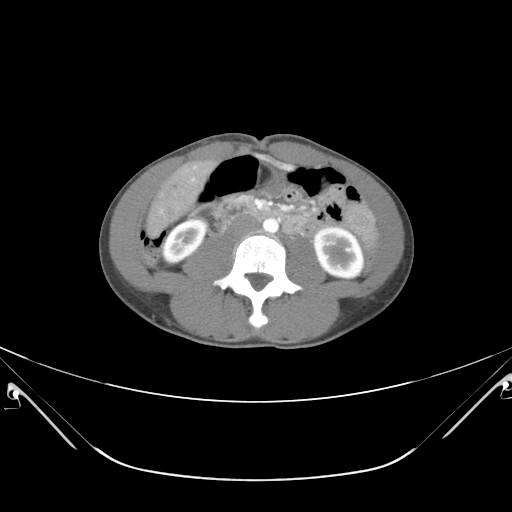
[im 59/85  soft-tissue]
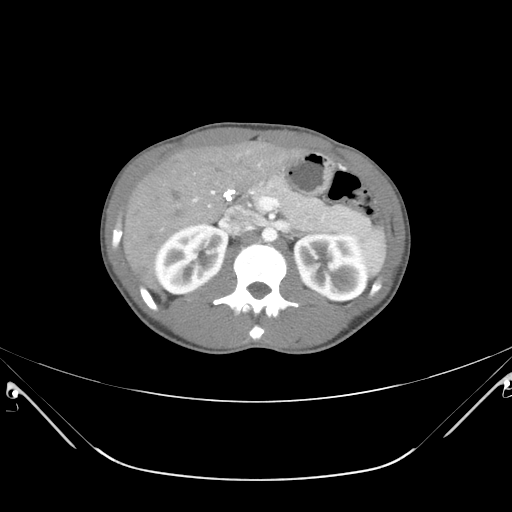
[im 66/85  soft-tissue]
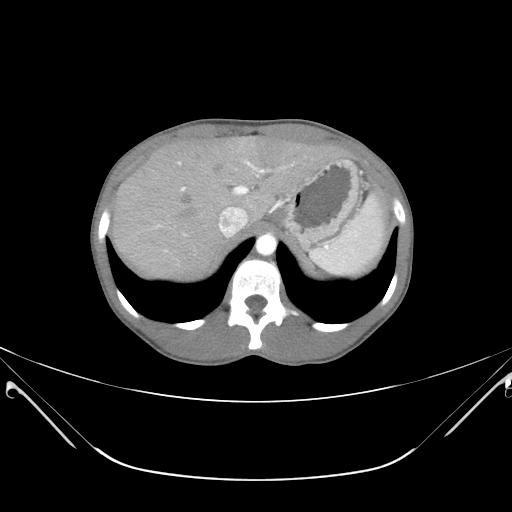
[im 77/85  soft-tissue]
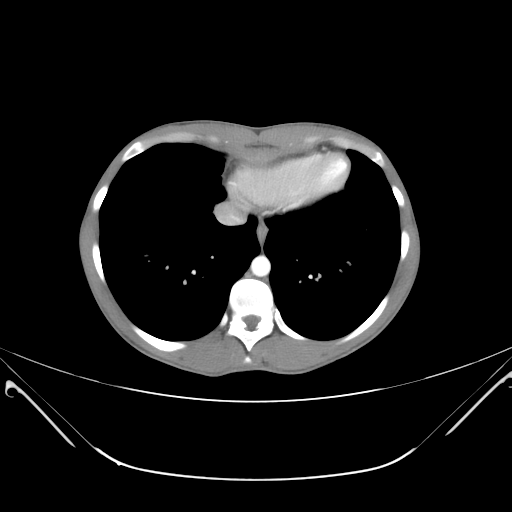

[Series 203: coronals, idose (2) · coronal · 0.45mm/px · 3 of 93 slices shown]
[im 31/93  soft-tissue]
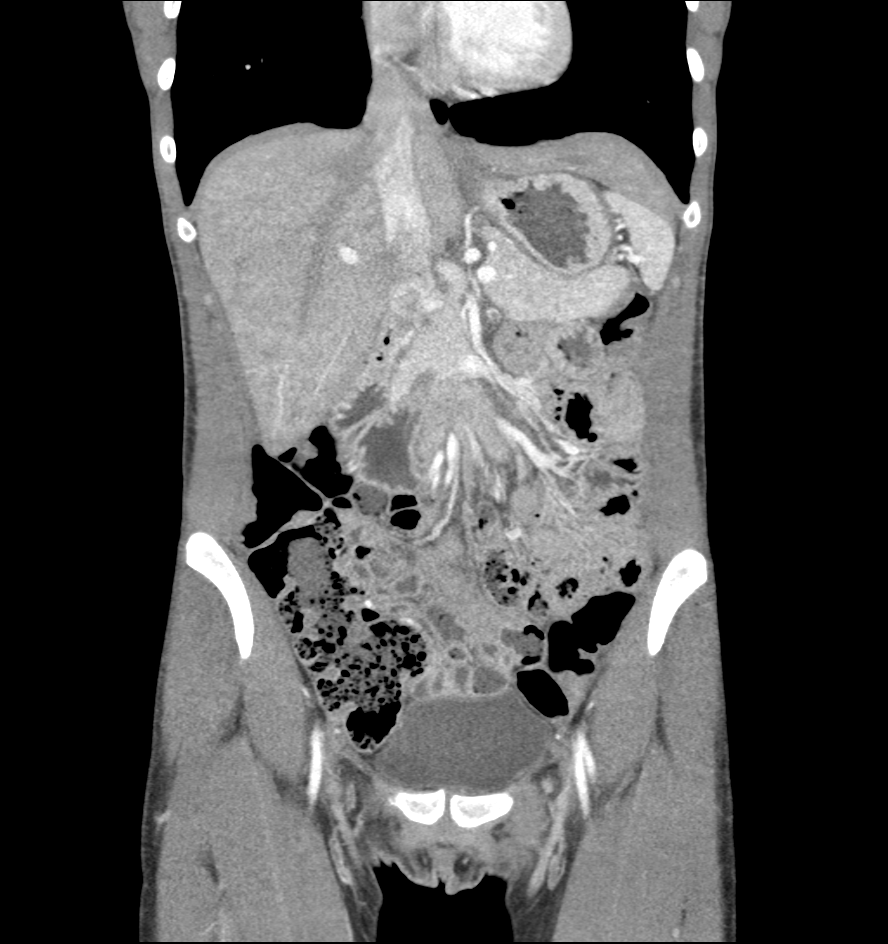
[im 41/93  soft-tissue]
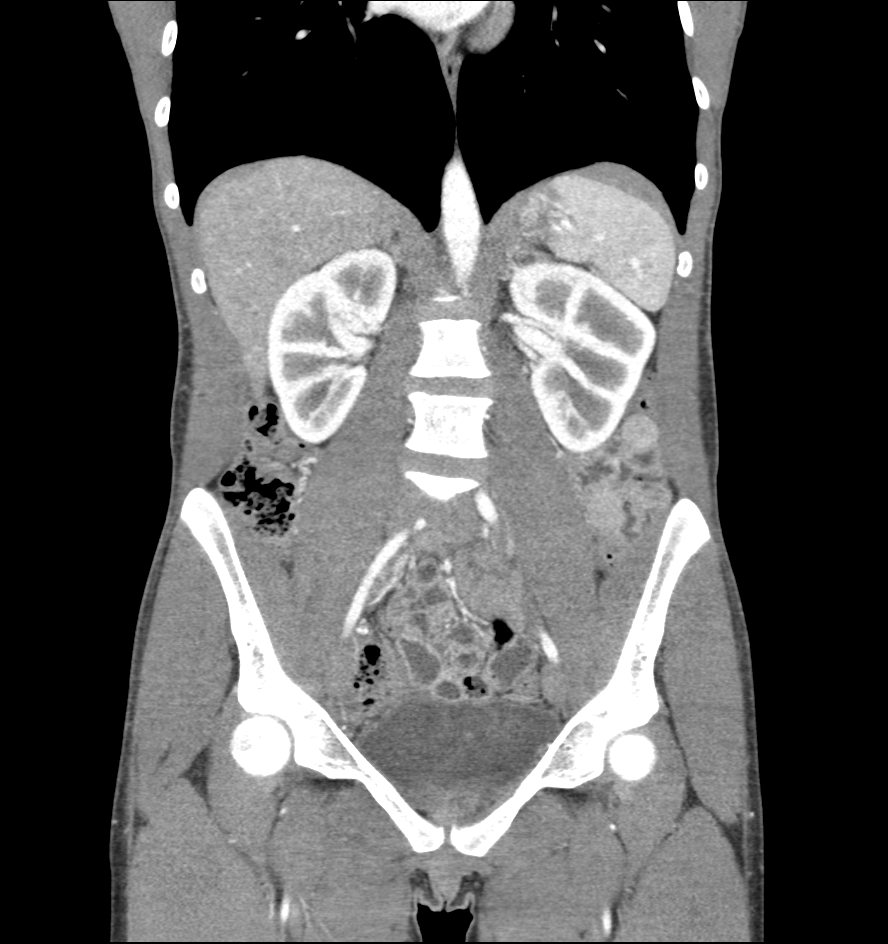
[im 52/93  soft-tissue]
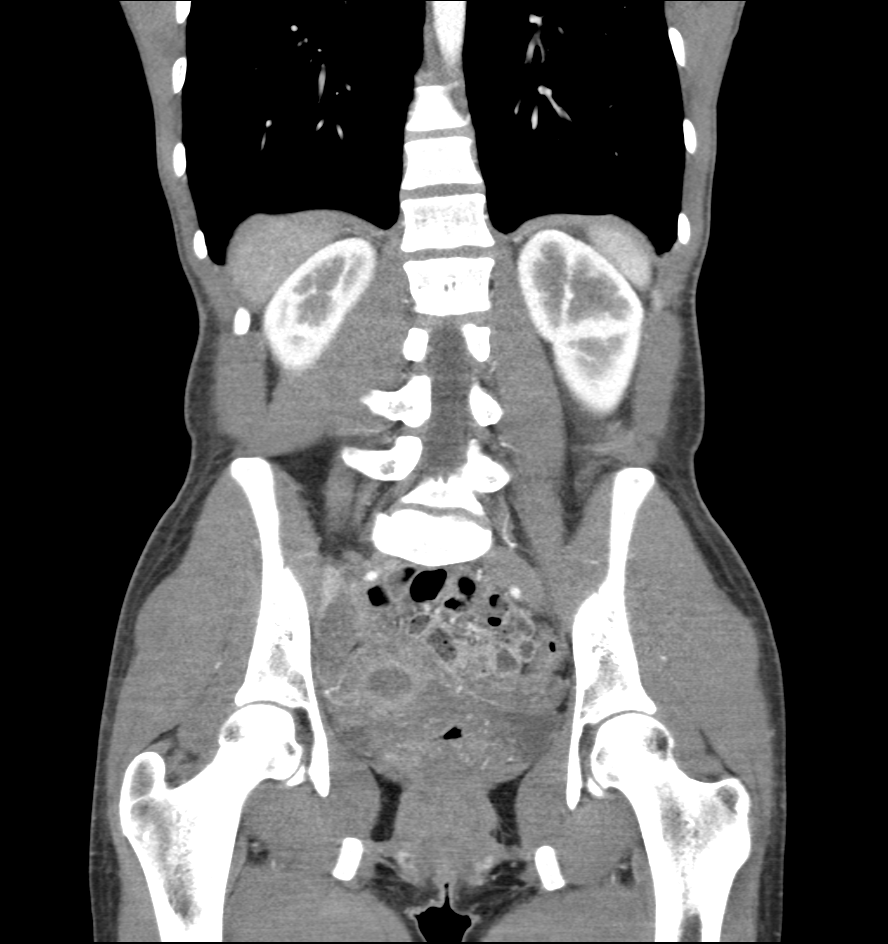

[11 of 46 positions shown; findings below may reference images not displayed]

FINDINGS: Lower chest: 4 mm subpleural nodule in the left lower lobe is post
infectious or inflammatory in a patient of this age. The lung bases
are otherwise clear.

Hepatobiliary: Probable focal fatty infiltration adjacent to the
falciform ligament. No suspicious hepatic lesion. Clips in the
gallbladder fossa postcholecystectomy. No biliary dilatation.

Pancreas: No ductal dilatation or inflammation.

Spleen: Normal in size without focal abnormality.

Adrenals/Urinary Tract: Adrenal glands are unremarkable. Kidneys are
normal, without renal calculi, focal lesion, or hydronephrosis.
Bladder is unremarkable.

Stomach/Bowel: Limited evaluation given lack of enteric contrast and
paucity of intra- abdominal fat. Stomach is physiologically
distended. Portions of the appendix are tentatively identified in
the right lower quadrant and normal. No evidence of pericecal or
right lower quadrant inflammation. No evidence of bowel inflammation
or distention given limitations.

Vascular/Lymphatic: No significant vascular findings are present. No
enlarged abdominal or pelvic lymph nodes.

Reproductive: A 12 mm peripherally enhancing cyst in the right ovary
is likely a corpus luteum. Left ovary grossly normal in size. The
uterus is unremarkable. Small volume free fluid in the pelvis is
physiologic.

Other: No evidence of free air

Musculoskeletal: There are no acute or suspicious osseous
abnormalities.
IMPRESSION: 1. Probable corpus luteal cyst in the right ovary, may be the cause
of right lower quadrant pain.
2. Appendix tentatively identified, no evidence of appendicitis.

## 2018-07-03 IMAGING — CT CT ABD-PELV W/ CM
2 of 4 series · 16 of 46 positions shown, 18 images · IV contrast (iopamidol)
Comparison: 04/28/2016

CLINICAL DATA: Right lower quadrant pain

EXAM:
CT ABDOMEN AND PELVIS WITH CONTRAST
TECHNIQUE: Multidetector CT imaging of the abdomen and pelvis was performed
using the standard protocol following bolus administration of
intravenous contrast.
CONTRAST:  75mL [id number] IOPAMIDOL ([id number]) INJECTION 61%

[Series 2: axial st · axial · 0.63mm/px · z∈[-418,-64]mm · 13 of 79 slices shown, 15 images]
[im 4/79  soft-tissue]
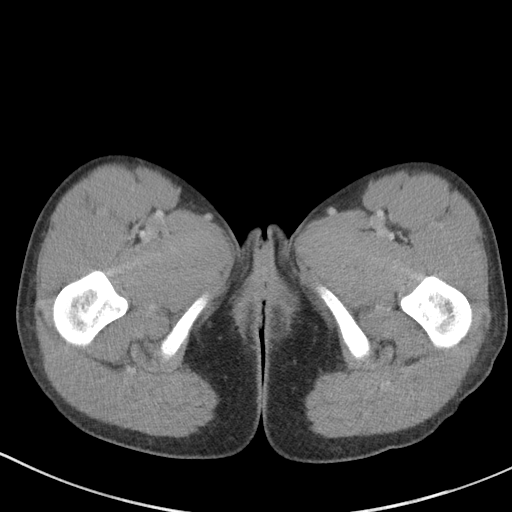
[im 4/79  bone]
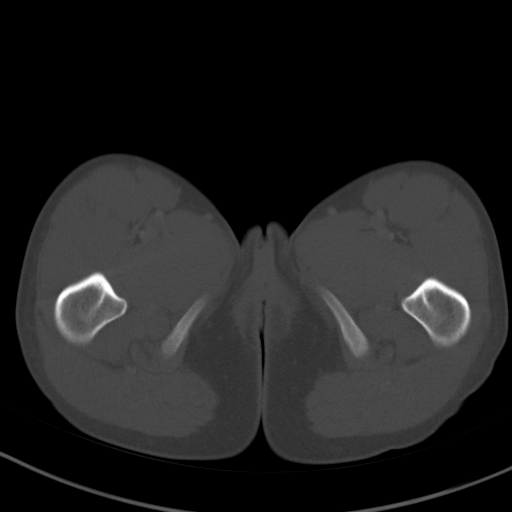
[im 10/79  soft-tissue]
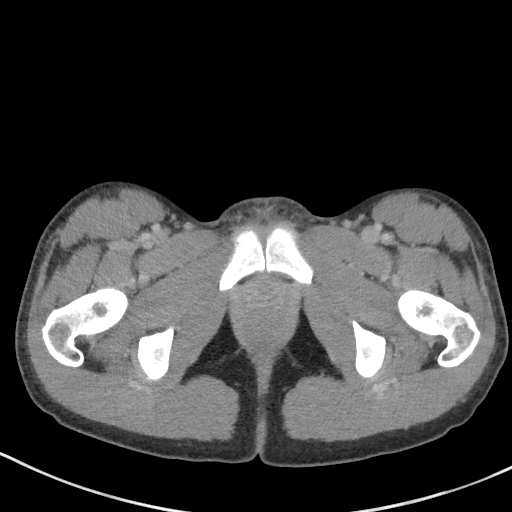
[im 17/79  soft-tissue]
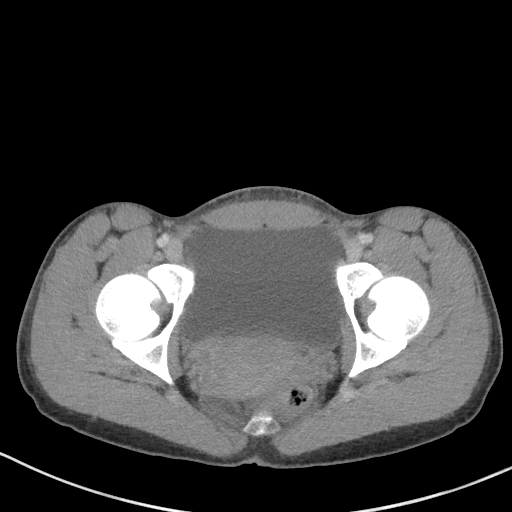
[im 23/79  soft-tissue]
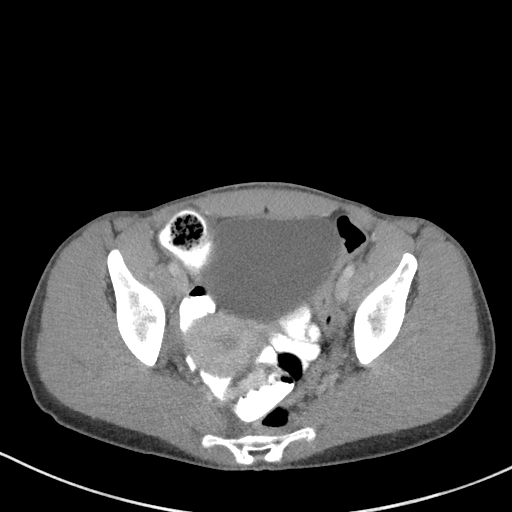
[im 27/79  soft-tissue]
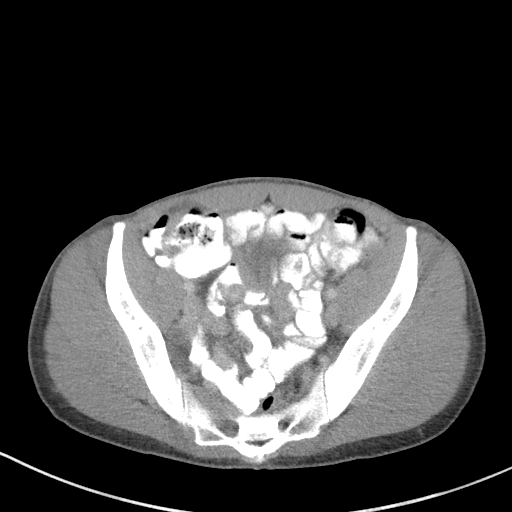
[im 33/79  soft-tissue]
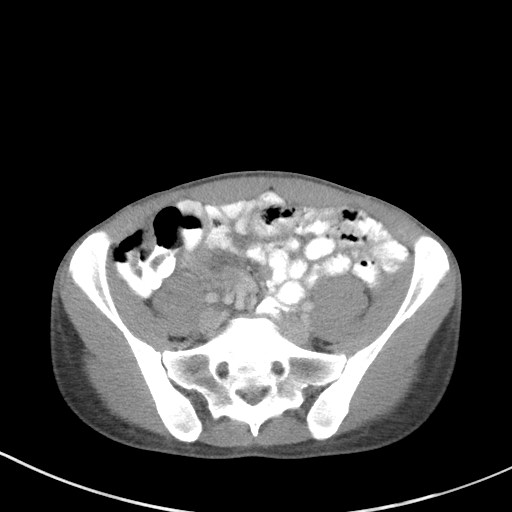
[im 40/79  soft-tissue]
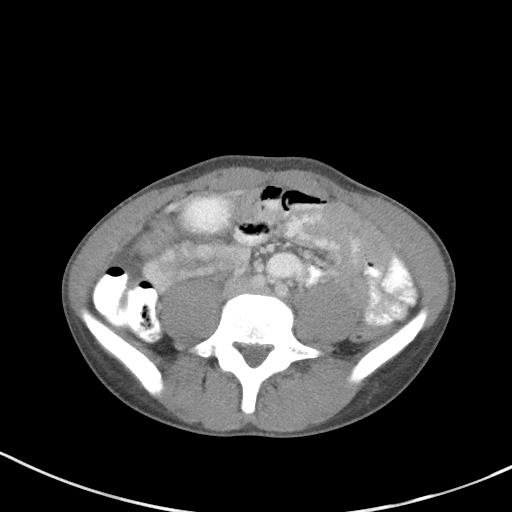
[im 46/79  soft-tissue]
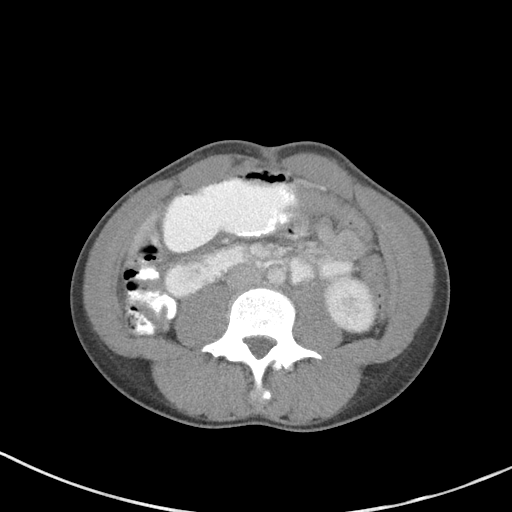
[im 53/79  soft-tissue]
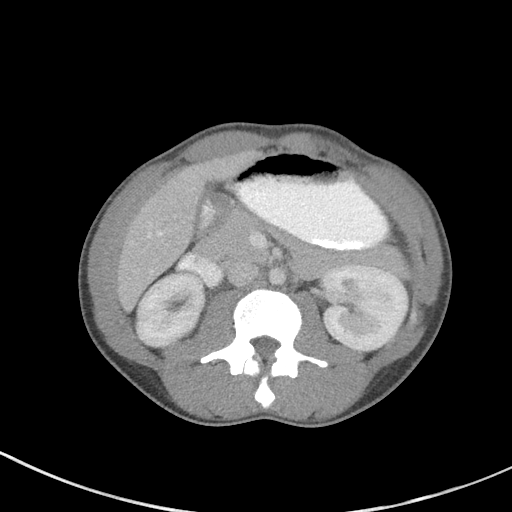
[im 53/79  bone]
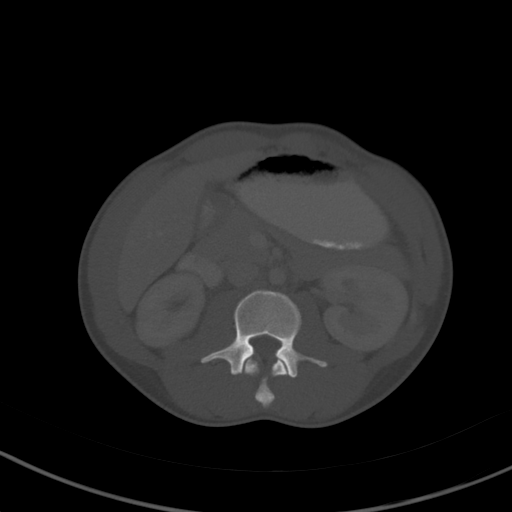
[im 56/79  soft-tissue]
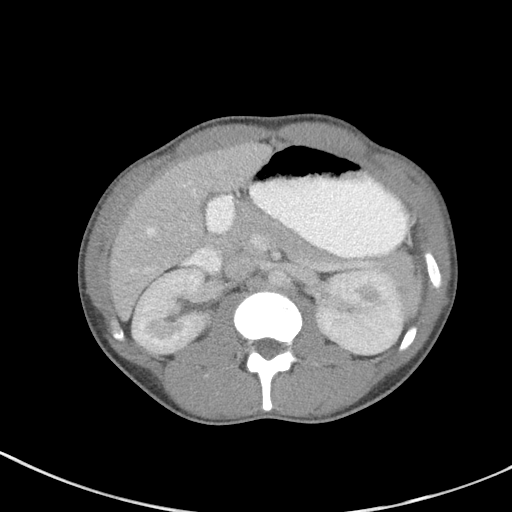
[im 62/79  soft-tissue]
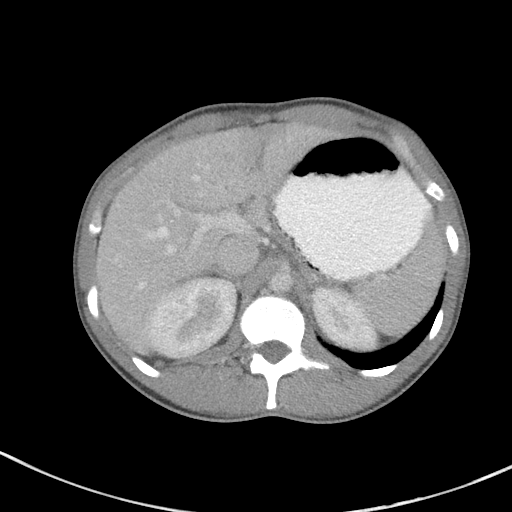
[im 69/79  soft-tissue]
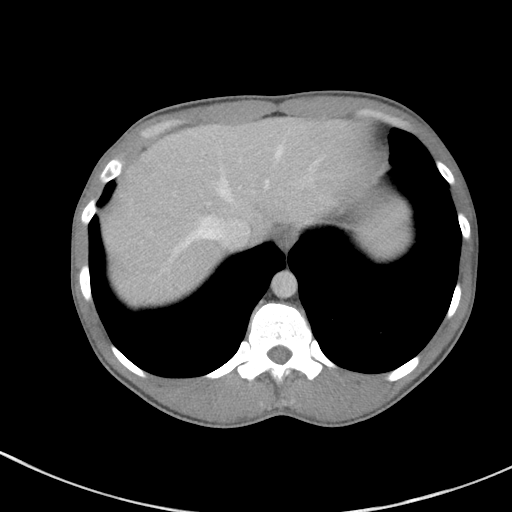
[im 75/79  soft-tissue]
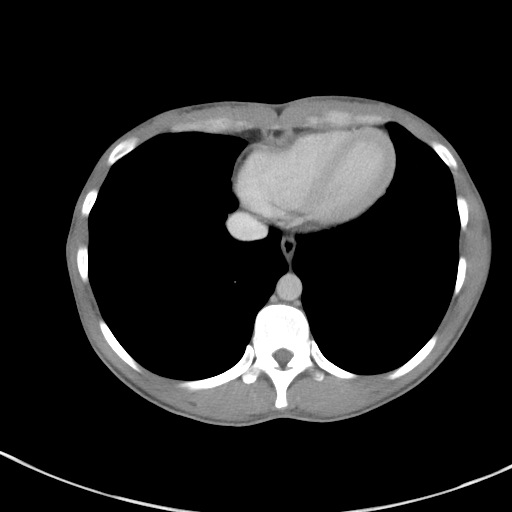

[Series 5: coronal st · coronal · 0.64mm/px · 3 of 74 slices shown]
[im 25/74  soft-tissue]
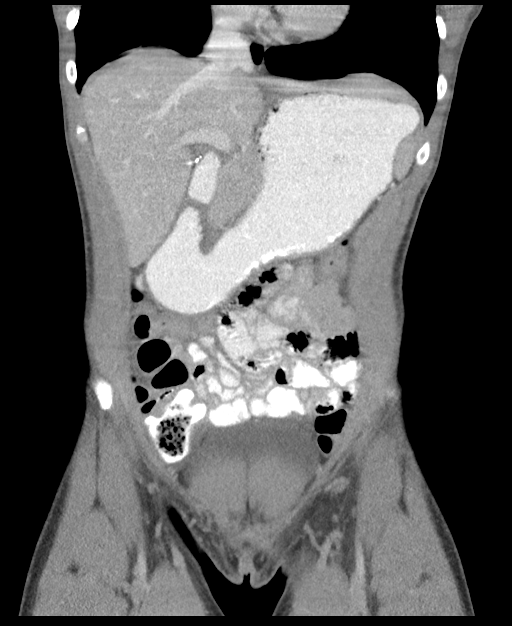
[im 33/74  soft-tissue]
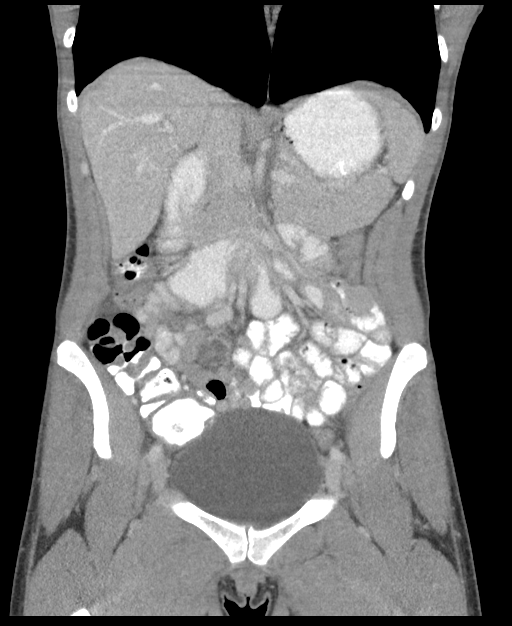
[im 41/74  soft-tissue]
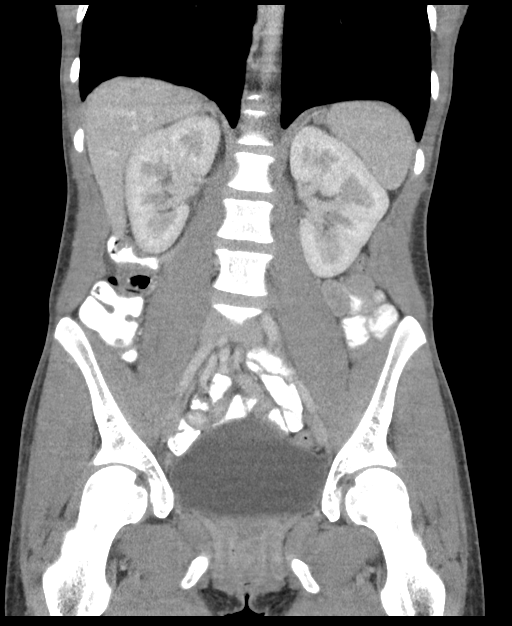

[16 of 46 positions shown; findings below may reference images not displayed]

FINDINGS: Lower chest: Nodule within the left lower lobe measures 3 mm and is
unchanged from previous exam. No pleural effusion identified.

Hepatobiliary: No focal liver abnormality. Previous cholecystectomy.
No biliary dilatation.

Pancreas: Unremarkable. No pancreatic ductal dilatation or
surrounding inflammatory changes.

Spleen: Normal in size without focal abnormality.

Adrenals/Urinary Tract: The adrenal glands are normal. No mass or
hydronephrosis identified. The urinary bladder appears normal.

Stomach/Bowel: The stomach is unremarkable. The small bowel loops
have a normal course and caliber. The appendix is not visualized
separate from the right lower quadrant bowel loops. No secondary
signs of acute appendicitis. No pathologic dilatation of the colon.

Vascular/Lymphatic: Normal appearance of the abdominal aorta. No
enlarged retroperitoneal or mesenteric adenopathy. No enlarged
pelvic or inguinal lymph nodes.

Reproductive: Uterus and bilateral adnexa are unremarkable.

Other: No abdominal wall hernia or abnormality. No abdominopelvic
ascites.

Musculoskeletal: No acute or significant osseous findings.
IMPRESSION: 1. No acute findings within the abdomen or pelvis.
2. Nonvisualization of the appendix. No secondary signs of acute
appendicitis.
